# Patient Record
Sex: Female | Born: 1995
Health system: Southern US, Community
[De-identification: ages and names within clinical notes are randomized; demographics above are authoritative.]

## PROBLEM LIST (undated history)

## (undated) DIAGNOSIS — J0391 Acute recurrent tonsillitis, unspecified: Secondary | ICD-10-CM

## (undated) HISTORY — PX: WISDOM TOOTH EXTRACTION: SHX21

---

## 2016-09-11 ENCOUNTER — Other Ambulatory Visit: Payer: Self-pay | Admitting: Family Medicine

## 2016-09-11 ENCOUNTER — Other Ambulatory Visit (HOSPITAL_COMMUNITY)
Admission: RE | Admit: 2016-09-11 | Discharge: 2016-09-11 | Disposition: A | Discharge status: Home / Self Care | Payer: 59 | Source: Ambulatory Visit | Attending: Family Medicine | Admitting: Family Medicine

## 2016-09-11 DIAGNOSIS — Z113 Encounter for screening for infections with a predominantly sexual mode of transmission: Secondary | ICD-10-CM | POA: Insufficient documentation

## 2016-09-11 DIAGNOSIS — R5383 Other fatigue: Secondary | ICD-10-CM | POA: Diagnosis not present

## 2016-09-11 DIAGNOSIS — Z124 Encounter for screening for malignant neoplasm of cervix: Secondary | ICD-10-CM | POA: Diagnosis not present

## 2016-09-11 DIAGNOSIS — Z23 Encounter for immunization: Secondary | ICD-10-CM | POA: Diagnosis not present

## 2016-09-11 DIAGNOSIS — Z0000 Encounter for general adult medical examination without abnormal findings: Secondary | ICD-10-CM | POA: Diagnosis not present

## 2016-09-14 LAB — CYTOLOGY - PAP

## 2016-09-26 DIAGNOSIS — Z91018 Allergy to other foods: Secondary | ICD-10-CM | POA: Diagnosis not present

## 2016-09-26 DIAGNOSIS — L509 Urticaria, unspecified: Secondary | ICD-10-CM | POA: Diagnosis not present

## 2016-10-08 DIAGNOSIS — M545 Low back pain: Secondary | ICD-10-CM | POA: Diagnosis not present

## 2016-10-08 DIAGNOSIS — M791 Myalgia: Secondary | ICD-10-CM | POA: Diagnosis not present

## 2016-11-13 ENCOUNTER — Encounter: Payer: 59 | Attending: Family Medicine | Admitting: *Deleted

## 2016-11-13 DIAGNOSIS — Z713 Dietary counseling and surveillance: Secondary | ICD-10-CM | POA: Diagnosis not present

## 2016-11-13 DIAGNOSIS — E440 Moderate protein-calorie malnutrition: Secondary | ICD-10-CM

## 2016-11-13 DIAGNOSIS — F509 Eating disorder, unspecified: Secondary | ICD-10-CM

## 2016-11-13 NOTE — Progress Notes (Signed)
Appointment start time: 1700  Appointment end time: 1800  Patient was seen on 11/13/16 for nutrition counseling pertaining to disordered eating  Primary care provider: Jarrett Sohoourtney Wharton PA Therapist: NA.  Referred to [facility] Any other medical team members: Dr. Riley KillSwartz, functional medicine   Assessment Sophomore year of college went to [place]  Fell into binge eating after that trip and not sure why.  Always had sweet tooth, but starting cycling eating a lot of cookies and then over-exercising and restriction.  used my fitness pal and was very restrictive and counted calories.  Ate 2200 kcal or as low as 1400  Gained 15 pounds.  That was an emotional struggle.  She realized that the gym was an image thing and that was not healthy.  Started calorie counting beofre binging started and then afterwards.  August of junior year, decided to stop.  It was better, but not consistent.  Did not get professional help.  Faith very strong.  Things have gotten slowly, slowly better.  Not actually counting, but mentally counting.  Switched to [employer] and that was somewhat helpful.  Senior year she started healing some, but then all this happened (see below). Her diet is so restricted, it's triggering.  She started going back to the gym about 3 weeks ago and feels it's healthy.  Her mind recognizes how she used to think.  She wants to make sure she has the healing she needs.   She is wanting to switch into behavior health for adolescents.  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  Starting this may everything I ate hurt so bad.  Self-diagnosed "leaky gut".  Found parasite in her stool for the past 3 years form [place].  Feels like she is depleted of many things.   She is taking some enzymes and some cortisol stimulants, D3, Zn, Mg (topical), pro biotic, prebiotics.  Every three weeks she does a liver flush and stops taking these and takes sublingual  supplemets that "are supposed to kill" whatever is in her liver.  She is taking a variety of other supplements and antofungal.  Then she "flushes that out and goes back to her "normal cycle of supplements."  She has been doing this since August.  She is feeling somewhat better, but still really fatigued.  She is working night shifts and that doesn't help. She has several food intolerances and she stopped eating things she has issues with ( see list ) Also doesn't eat dairy and gluten  And then also stopped eating her allergens and that helps her feel better.   Struggles getting in protein.  Cant' eat any meat, eggs, beans, soy, peas  Can have some nuts (pecans, peanuts, pumpkin seeds, maybe chia seeds  She knows this is a temporary situation and that as her gut heals she will be able to reintroduce foods    Weight history:  Highest weight: 133 lb   Lowest weight: 110 lb Most consistent weight: 118 lb  What would you like to weigh:doesnt' matter How has weight changed in the past year: lost about 20 pounds, then restored about 5-7  Medical Information:  Changes in hair, skin, nails since ED started: feels like hair is thinner, always had thin nails Chewing/swallowing difficulties : none Relux or heartburn: heaviness on her chest Trouble with teeth: none LMP without the use of hormones: April, cycles irregular.  Low hormone  Weight at that point: 130 lb  Effect of exercise on menses : irregular with excessive exercise    Constipation, diarrhea: normal BM Dizziness/lightheadedness with postural changes or overexertion Headaches when she ate allergens.  Better Poor energy/fatigue Good mood Difficulty focusing/concentrating Poor sleep.  That is a long standing issue.  Takes melatonin to sleep during the day.  Anxiety.  Never taken medication struggles with short term memory loss  Mental health diagnosis: unknown at this time.  May meet criteria for OSFED   Dietary assessment: A typical  day consists of 2 meals and 5 snacks Very restricted not due to ED thoughts, but to perceived medical necessity   foods consummed include: see list Avoided foods include: see list  24 hour recall:  B: buckwheat with strawberries  3 sweet potato, yellow squash, broccoli, colla-gelatin Grapes, applesauce Water, sufficient  Administered EAT-26 Score significant >20 Patient score: 14  Terrified of being overweight; aware of the calories I eat; gone on binges 2-3 times/month  ORTO-15 Score significant <39 Patient score: 31  Estimated energy intake: 900 kcal  Estimated energy needs: 2000-2400 kcal 250-300 g CHO 100-120 g pro 67-80 g fat  Nutrition Diagnosis: NI-1.4 Inadequate energy intake As related to restrictive diet from perceived food intolerances.  As evidenced by dietary recall.  Intervention/Goals: Nutrition counseling provided.  Built rapport.  Again recommended [facility].  This provider questions the necessity of such a severely restricted diet.  Many of her symptoms could be improved with improved nutrition.  Will focus on improving nutritional status with "approved foods" first.    Gave recommended foods with higher protein content   Monitoring and Evaluation: Patient will follow up in 1 weeks.

## 2016-11-13 NOTE — Patient Instructions (Signed)
Protein  Quinoa Sunbutter on rice cake Brewer's yeast, nutritional yeast Peanut Salmon Shrimp Flounder aramanth Hemp seeds/hemp milk Rice milk  broccoli Artichoke chia seeds Bison Venison   http://[email or web address].[email or web address]/store/dates-[email or web address]-seed   Bars [email or web address]   granola

## 2016-11-15 DIAGNOSIS — F502 Bulimia nervosa: Secondary | ICD-10-CM | POA: Diagnosis not present

## 2016-11-20 ENCOUNTER — Encounter: Payer: 59 | Admitting: *Deleted

## 2016-11-20 DIAGNOSIS — Z713 Dietary counseling and surveillance: Secondary | ICD-10-CM | POA: Diagnosis not present

## 2016-11-20 DIAGNOSIS — F509 Eating disorder, unspecified: Secondary | ICD-10-CM

## 2016-11-20 DIAGNOSIS — E441 Mild protein-calorie malnutrition: Secondary | ICD-10-CM

## 2016-11-20 NOTE — Patient Instructions (Addendum)
flaxmilk and flax yogurt  http://goodkarmafoods.com/product/unsweetened-protein-flaxmilk/  Food for [employer] Brown rice English Muffins  Chex cereal  EnerG tapioca loaf (orange fiber)  [place] Kitchen rice noodles Miracle Noodle Shirataki pasta Ranzoni gluten free noodles (some corn) King Soba buckwheat noodles  Consider doing some functional research and cook!

## 2016-11-20 NOTE — Progress Notes (Signed)
  Appointment start time: 0800  Appointment end time: 0900  Patient was seen on 11/20/16 for nutrition counseling pertaining to disordered eating  Primary care provider: Marda Stalker PA Therapist: NA.  Referred to [facility] Any other medical team members: Dr. Naaman Plummer, functional medicine   Assessment Tried all the food suggested last week and everything was fine, but chia seeds.  She ordered amaranth, but hasn't tried it yet.  Bought brown rice noodles, nutritional yeast.  She asked her functional medicine doctor about brewers yeast and she said not to.  "She is his wife is a "nutritional detox coach" and gives nutrition advice She tried cacoa and is using it in smoothies.  She bought rice milk, but feels that it had too much sugar for her gut.  She thinks she can't eat sugar because it will not be good for her "leaky gut".  She admits that she follows a more strict diet than directed because she wants to rid her body of the parasites and heal her leaky gut  Met with Lenna Sciara carmona and that went well    Is waiting to hear back about job placement   States she saw a GI I the past and felt unsupported.  She had been reading blogs and decided to persue functional medicine.  She is happy with the path that she is on.  She suspects this will take a couple months  Her stool sample was negative.  Her functional medicine doctor explained why that could happens because "traditional doctors don't test for the things she has".      Mental health diagnosis: unknown at this time.  May meet criteria for OSFED   Dietary assessment: A typical day consists of 2 meals and 5 snacks Very restricted not due to ED thoughts, but to perceived medical necessity   foods consummed include: see list Avoided foods include: see list  24 hour recall:  B: oatmeal with sunbutter, strawberries, nutritional yeast L: potato, salmon, broccoli (all plain), cantaloupe S: peacan, pumpkin seeds D:  flounder with spices and olive oil, zucchini and yellow squash S: PB and celery smooothie with rice milk, protein powder (rice protein), cacao Water, sufficient   Estimated energy intake: 1700 kcal  Estimated energy needs: 2000-2400 kcal 250-300 g CHO 100-120 g pro 67-80 g fat  Nutrition Diagnosis: NI-1.4 Inadequate energy intake As related to restrictive diet from perceived food intolerances.  As evidenced by dietary recall.  Intervention/Goals: Nutrition counseling provided. Questioned her "nutrition detox coach" and the highly restrictive "liver detox diet" she's on every 3 weeks.  Will continue to build upon her list of accepted foods.  She was willing to try some new things these next 2 weeks  Monitoring and Evaluation: Patient will follow up in 2 weeks.

## 2016-11-27 ENCOUNTER — Other Ambulatory Visit: Payer: Self-pay | Admitting: *Deleted

## 2016-11-29 DIAGNOSIS — F502 Bulimia nervosa: Secondary | ICD-10-CM | POA: Diagnosis not present

## 2016-12-06 ENCOUNTER — Encounter: Payer: 59 | Attending: Family Medicine | Admitting: *Deleted

## 2016-12-06 DIAGNOSIS — F509 Eating disorder, unspecified: Secondary | ICD-10-CM

## 2016-12-06 DIAGNOSIS — Z713 Dietary counseling and surveillance: Secondary | ICD-10-CM | POA: Insufficient documentation

## 2016-12-06 NOTE — Progress Notes (Signed)
  Appointment start time: 1500  Appointment end time: 1600  Patient was seen on 12/06/16 for nutrition counseling pertaining to disordered eating  Primary care provider: Jarrett Sohoourtney Wharton PA Therapist: NA.  Referred to [facility] Any other medical team members: Dr. Riley KillSwartz, functional medicine   Assessment She is on her liver flush week, but has been making her own food using the foods that she is allowed to have.  She is cooking more and she is more satisfied.  She is still not using fat.  She is eating a lot of fruit too.  She is consumed with thoughts of food.  She realizes this is not a healthy thought process She is trying to be more aware of what she is eating and when. She states she is satisfies with the food that she is eating even though she is restricting  She has been reading Intuitive Eating.  She stopped eating actually as it is not applicable to her diet right now She is noticing that her behaviors.     Mental health diagnosis: unknown at this time.  May meet criteria for OSFED   Dietary assessment: A typical day consists of 2 meals and 5 snacks Very restricted not due to ED thoughts, but to perceived medical necessity   foods consummed include: see list Avoided foods include: see list  24 hour recall:  B: buckwheat with strawberries S: Applesauce L: noodles with butternut squash SHoneydew spaghetti squash with pumpin puree Rice with vegetables.   Estimated energy intake: 1700 kcal  Estimated energy needs: 2000-2400 kcal 250-300 g CHO 100-120 g pro 67-80 g fat  Nutrition Diagnosis: NI-1.4 Inadequate energy intake As related to restrictive diet from perceived food intolerances.  As evidenced by dietary recall.  Intervention/Goals: Nutrition counseling provided. Answered questions.  She questions the approach she is currently taking as it does not align with her recovery process.  She is frustrated, however.  She plans to do more research.  Gave  more recipes for foods she can eat.    Monitoring and Evaluation: Patient will follow up in 2 weeks.

## 2016-12-13 DIAGNOSIS — M25511 Pain in right shoulder: Secondary | ICD-10-CM | POA: Diagnosis not present

## 2016-12-13 DIAGNOSIS — S43431D Superior glenoid labrum lesion of right shoulder, subsequent encounter: Secondary | ICD-10-CM | POA: Diagnosis not present

## 2016-12-13 DIAGNOSIS — F5081 Binge eating disorder: Secondary | ICD-10-CM | POA: Diagnosis not present

## 2016-12-13 DIAGNOSIS — M6281 Muscle weakness (generalized): Secondary | ICD-10-CM | POA: Diagnosis not present

## 2016-12-17 DIAGNOSIS — R140 Abdominal distension (gaseous): Secondary | ICD-10-CM | POA: Diagnosis not present

## 2016-12-17 DIAGNOSIS — R109 Unspecified abdominal pain: Secondary | ICD-10-CM | POA: Diagnosis not present

## 2016-12-17 DIAGNOSIS — R195 Other fecal abnormalities: Secondary | ICD-10-CM | POA: Diagnosis not present

## 2016-12-19 ENCOUNTER — Encounter: Payer: 59 | Admitting: *Deleted

## 2016-12-19 DIAGNOSIS — Z713 Dietary counseling and surveillance: Secondary | ICD-10-CM | POA: Diagnosis not present

## 2016-12-19 DIAGNOSIS — F509 Eating disorder, unspecified: Secondary | ICD-10-CM

## 2016-12-19 NOTE — Progress Notes (Signed)
Appointment start time: 0800  Appointment end time: 0900  Patient was seen on 12/19/16 for nutrition counseling pertaining to disordered eating  Primary care provider: Jarrett Sohoourtney Wharton PA Therapist: Aaron MoseMelissa Carmona Any other medical team members: Dr. Riley KillSwartz, functional medicine   [facility]  Provider is waiting for stool sample in order to receive treatment.  Has appointment coming up with allergist.  Jessica BarriosWonders if she should keep it?    She plans to take pieces from each provider and find the right balance for her.  She decided to prioritize her mental health and has been eating lots of cake!!! "no one told me not to eat these things, I just did it myself." It hurt, but felt really good mentally/emotioanlly.   She realizes she is "mentally dying" and she really needs to heal her realtionship with food.  She also ate some reese's yesterday.  She gets bloated and crampy and sometimes nauseated, but is ok with that as she is starting to enjoy food again  She ate a lot of cake and didn't feel guilty! She felt free and that cake doesn't have control over her  She is trying to be more mindful when eating sweets.  She is talking herself through it and challenging Ed thoughts. She is more aware of how she feels when eating pie/cake.  At a party ate deviled eggs, mac-n-cheese, potato salad, roll with butter, collard greens.  She did feel terribly physically afterwards.    She is reading Intutiive Eating still.  She is struggling with how can she heal both physically and mentally? Is eating less fruit unintentionally.  Has been eating some frozen fruit and dates Tried raw honey and thinks she got a headache as a result  Found cashew yogurt and it's ok.  Has a lot of questions about other supplements like butyric acid, Bromaline, colostrum, turmeric, multivitamin, and probiotics      Mental health diagnosis: unknown at this time.  May meet criteria for OSFED     Estimated energy  needs: 2000-2400 kcal 250-300 g CHO 100-120 g pro 67-80 g fat  Nutrition Diagnosis: NI-1.4 Inadequate energy intake As related to restrictive diet from perceived food intolerances.  As evidenced by dietary recall.  Intervention/Goals: Nutrition counseling provided. Encouraged keeping all appointments with evidenced-based physicians, including allergist.   Had discussion about all the supplements she is taking.. Are they necessary?  Are they helpful?  They are expensive.  Focus on getting rid of parasites per GI instructions, then work on the "leaky gut".  So excited for her increased food variety.  Discussed how to move forward with protein.  Gave some suggestions for supplements.  Addressed sweets eating as side effect of restriction.  Will normalize   Monitoring and Evaluation: Patient will follow up in 2 weeks.

## 2016-12-19 NOTE — Patient Instructions (Addendum)
For a multivitmin try Centrum For fish oil try Nordic Naturals or Nature's Made Probiotic.  Culturelle has 1 strain, but it's what my doctor friends recommend  Gradually try 1 new protein or other food in its most basic form to see how it affects you. See what Kefir does

## 2016-12-21 ENCOUNTER — Ambulatory Visit: Payer: 59 | Admitting: *Deleted

## 2016-12-26 DIAGNOSIS — F5081 Binge eating disorder: Secondary | ICD-10-CM | POA: Diagnosis not present

## 2017-01-01 ENCOUNTER — Encounter: Payer: 59 | Attending: Family Medicine | Admitting: *Deleted

## 2017-01-01 DIAGNOSIS — F509 Eating disorder, unspecified: Secondary | ICD-10-CM

## 2017-01-01 DIAGNOSIS — Z713 Dietary counseling and surveillance: Secondary | ICD-10-CM | POA: Diagnosis not present

## 2017-01-01 NOTE — Progress Notes (Signed)
Appointment start time: 0800  Appointment end time: 0900  Patient was seen on 01/01/17 for nutrition counseling pertaining to disordered eating  Primary care provider: Jarrett Sohoourtney Wharton PA Therapist: Aaron MoseMelissa Carmona Any other medical team members: Dr. Riley KillSwartz, functional medicine; [facility]   Assessment Is on her liver flush and plans to give stool sample to GI this week. [facility].  Needs to work days for her health.  She is not taking anything new this week, but will be taking probiotic, prebiotic, omega-3, multi, butyrate, magnesium, zinc, and will hold off on colostrum.  Wants to try sourkraut and maybe kamboucha for natural gut flora. Stopped taking everything else. Plans to keep allergist appointment on 1/16  Tried roast beef sandwich and it was ok at first.  Duke Energyried luncheon meat later and it wasn't good.  Tried pea protein drink and it made her throat itchy.  Plans to try beans Tried GF waffles and it was ok.  Tried Cookout milkshake and it was somewhat bloaty.  Did not try kefir  Had been making progress with herself mentally.  Knew the flush week would be hard and tried to give herself permission and grace.  Still feels good about her progress.  Is still reading Intuitive Eating and is trying to give herself more positive affirmations about her eating and her body.    She has been practicing inactive eating at a christmas buffet and did great.  She was really proud of herself   tried 1% milk and throat felt a little itchy Tried greek yogurt and that was a big no (it was flavored)  Is thinking about shortening her next flush to 3 days only.  Concerned about her future diet once parasites are gone.  Her "research" indicates needing to limit grains, gluten, and soy.  How will she be able to eat intuitively if she's on a chronically restricted diet?   Mental health diagnosis: unknown at this time.  May meet criteria for OSFED   Dietary recall White  rice caulilfour  Sweet potatoes Applesauce and applejuice Zucchini noodles with peppers and nutritional yeast Greek yogurt  Estimated energy needs: 2000-2400 kcal 250-300 g CHO 100-120 g pro 67-80 g fat  Nutrition Diagnosis: NI-1.4 Inadequate energy intake As related to restrictive diet from perceived food intolerances.  As evidenced by dietary recall.  Intervention/Goals: Nutrition counseling provided.  Discussed realistic expectations and giving herself permission and grace.  Discussed not knowing what her future nutrition needs will be as she is not healed yet.  She may not need to follow a restricted diet in the future.  I suspect it will not be necessary, but will consult with literature and a colleague to figure out what course of action is best for a "leaky gut."  She will probably be able to eat more once she is healed.  Even so, focus on what she can eat, rather than what she can't and give permission for flexibility.    Continue to try incorporating things back into diet.  (plans to try beef and kefir). Keep appointments with allergist and GI   Monitoring and Evaluation: Patient will follow up in 2 weeks.

## 2017-01-01 NOTE — Patient Instructions (Addendum)
Try Valerian for sleep. Tea, tincture (available everywhere try [employer] or online), or supplement Check out [address] [facility] [employer], both on [address]  Siete Azahares tea

## 2017-01-02 DIAGNOSIS — R195 Other fecal abnormalities: Secondary | ICD-10-CM | POA: Diagnosis not present

## 2017-01-11 DIAGNOSIS — F5081 Binge eating disorder: Secondary | ICD-10-CM | POA: Diagnosis not present

## 2017-01-15 ENCOUNTER — Ambulatory Visit (INDEPENDENT_AMBULATORY_CARE_PROVIDER_SITE_OTHER): Payer: 59 | Admitting: Allergy and Immunology

## 2017-01-15 ENCOUNTER — Encounter: Payer: 59 | Admitting: *Deleted

## 2017-01-15 ENCOUNTER — Encounter: Payer: Self-pay | Admitting: Allergy and Immunology

## 2017-01-15 VITALS — BP 80/62 | HR 60 | Temp 97.6°F | Resp 20 | Ht 63.35 in | Wt 118.4 lb

## 2017-01-15 DIAGNOSIS — J3089 Other allergic rhinitis: Secondary | ICD-10-CM | POA: Diagnosis not present

## 2017-01-15 DIAGNOSIS — B89 Unspecified parasitic disease: Secondary | ICD-10-CM | POA: Diagnosis not present

## 2017-01-15 DIAGNOSIS — G43909 Migraine, unspecified, not intractable, without status migrainosus: Secondary | ICD-10-CM

## 2017-01-15 DIAGNOSIS — F509 Eating disorder, unspecified: Secondary | ICD-10-CM

## 2017-01-15 DIAGNOSIS — N912 Amenorrhea, unspecified: Secondary | ICD-10-CM

## 2017-01-15 DIAGNOSIS — T781XXA Other adverse food reactions, not elsewhere classified, initial encounter: Secondary | ICD-10-CM | POA: Diagnosis not present

## 2017-01-15 DIAGNOSIS — Z91018 Allergy to other foods: Secondary | ICD-10-CM

## 2017-01-15 DIAGNOSIS — Z713 Dietary counseling and surveillance: Secondary | ICD-10-CM | POA: Diagnosis not present

## 2017-01-15 MED ORDER — AUVI-Q 0.3 MG/0.3ML IJ SOAJ: INTRAMUSCULAR | 1 refills | Status: DC

## 2017-01-15 MED ORDER — IVERMECTIN 3 MG PO TABS: ORAL_TABLET | ORAL | 0 refills | Status: DC

## 2017-01-15 NOTE — Patient Instructions (Signed)
Mug cake Mix flour, sugar, cocoa powder, baking soda, and salt in a microwave-safe mug; stir in milk, canola oil, water, and vanilla extract. Cook in microwave until cake is done in the middle, about 1 minute 45 seconds.  [email or web address]/mug-cakes/  Try sorbet (dairy free) or freeze flax milk so it's kinda like ice creamy Rice pudding So Delicious brand cashew milk ice cream

## 2017-01-15 NOTE — Progress Notes (Signed)
Dear Jarrett Sohoourtney Wharton,  Thank you for referring Jessica Finley to the [facility] [place] on 01/15/2017.   Below is a summation of this patient's evaluation and recommendations.  Thank you for your referral. I will keep you informed about this patient's response to treatment.   If you have any questions please do not hesitate to contact me.   Sincerely,  Jessica PriestEric J. Azan Maneri, MD [facility] [place]   ______________________________________________________________________    NEW PATIENT NOTE  Referring Provider: Jarrett SohoWharton, Courtney, PA-C Primary Provider: Jarrett SohoWharton, Courtney, PA-C Date of office visit: 01/15/2017    Subjective:   Chief Complaint:  Jessica Finley (DOB: 07/09/1996) is a 21 y.o. female who presents to the clinic on 01/15/2017 with a chief complaint of Abdominal Pain .     HPI: Maralyn SagoSarah presents to this clinic in evaluation of food problems.  Apparently she's been having difficulty with specific food product consumption. If she eats most fruits or legumes and avocado and cashew and almonds and cinnamon and egg she will develop itchy throat. If she has cooked fruits such as cooked apple she does fine. She's tried an antihistamine which has helped this issue. In addition, she will sometimes develops stomach cramps with consumption of mammal and chicken and dairy and gluten. She has no other associated systemic or constitutional symptoms associated with these reactions. This has been a problem over the course of the past several years.  Evaluation to date has included blood test which apparently did not identify any celiac disease and detection of IgE antibodies directed against multiple food products.  In addition, she states that she has a parasitic infection of her gout. She has seen worms in her stools. She's had 2 stools specimens apparently examined for ova and parasite which have been negative. However,  even though these are negative she has seen definite worms in her stool. She is now being treated by a homeopathic doctor, Dr. Hermelinda MedicusSchwartz, and is using "liver flushes" and various other homeopathic treatments in an attempt to eliminate her parasitic load.  She also has a history of itchy eyes and sneezing and some nasal congestion during the spring time. In addition, she definitely gets itchy throat for the spring and into the fall. She has had a history of "recurrent tonsillitis" during her college years that appear to respond to the administration of an antihistamine. She has minimal GERD symptoms and does not have any throat clearing or recurrent raspy voice or glob stuck in her throat.  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 She has this bitemporal throbbing headache unassociated with any visual scotoma or other neurological symptoms that last about 5 hours. She can function during this timeframe. She believes that consumption of chicken gives rise to migraine. But she also has migraines about every few days averaging out twice a week even without consumption of chicken. She does eat chocolate about 1 time per week but does not have any significant caffeine consumption otherwise.  Past Medical History:  Diagnosis Date  . Parasitic infection 2017   Infection in liver.    History reviewed. No pertinent surgical history.  Allergies as of 01/15/2017   No Known Allergies     Medication List      COD LIVER OIL PO Take 5 mLs  by mouth daily.   multivitamin capsule Take 1 capsule by mouth daily.   NUTRITIONAL SUPPLEMENT PO Take by mouth. Bilex.   PROBIOTIC-10 PO Take by mouth.       Review of systems negative except as noted in HPI / PMHx or noted below:  Review of Systems  Constitutional: Negative.   HENT: Negative.   Eyes:  Negative.   Respiratory: Negative.   Cardiovascular: Negative.   Gastrointestinal: Negative.   Genitourinary: Negative.   Musculoskeletal: Negative.   Skin: Negative.   Neurological: Negative.   Endo/Heme/Allergies: Negative.   Psychiatric/Behavioral: Negative.     Family History  Problem Relation Age of Onset  . High blood pressure Father   . Lactose intolerance Father   . Food Allergy Brother   . Congestive Heart Failure Maternal Grandfather   . Diabetes Paternal Grandmother   . Congestive Heart Failure Paternal Grandfather   . Kidney disease Paternal Grandfather     Social History   Social History  . Marital status: Unknown    Spouse name: N/A  . Number of children: N/A  . Years of education: N/A   Occupational History  . Not on file.   Social History Main Topics  . Smoking status: Never Smoker  . Smokeless tobacco: Never Used  . Alcohol use Yes  . Drug use: No  . Sexual activity: Not on file   Other Topics Concern  . Not on file   Social History Narrative  . No narrative on file    Environmental and Social history  Lives in a apartment with a dry environment, carpeting in the bedroom, no plastic on the bed or pillow, and no smoking ongoing inside the household. 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  Objective:   Vitals:   01/15/17 0914  BP: (!) 80/62  Pulse: 60  Resp: 20  Temp: 97.6 F (36.4 C)   Height: 5' 3.35" (160.9 cm) Weight: 118 lb 6.4 oz (53.7 kg)  Physical Exam  Constitutional: She is well-developed, well-nourished, and in no distress.  HENT:  Head: Normocephalic. Head is without right periorbital erythema and without left periorbital erythema.  Right Ear: Tympanic membrane, external ear and ear canal normal.  Left Ear: Tympanic membrane, external ear and ear canal normal.  Nose: Nose normal. No mucosal edema or rhinorrhea.  Mouth/Throat: Oropharynx is clear and moist and mucous membranes are  normal. No oropharyngeal exudate.  Eyes: Conjunctivae and lids are normal. Pupils are equal, round, and reactive to light.  Neck: Trachea normal. No tracheal deviation present. No thyromegaly present.  Cardiovascular: Normal rate, regular rhythm, S1 normal, S2 normal and normal heart sounds.   No murmur heard. Pulmonary/Chest: Effort normal. No stridor. No tachypnea. No respiratory distress. She has no wheezes. She has no rales. She exhibits no tenderness.  Abdominal: Soft. She exhibits no distension and no mass. There is no hepatosplenomegaly. There is no tenderness. There is no rebound and no guarding.  Musculoskeletal: She exhibits no edema or tenderness.  Lymphadenopathy:       Head (right side): No tonsillar adenopathy present.       Head (left side): No tonsillar adenopathy present.    She has no cervical adenopathy.    She has no axillary adenopathy.  Neurological: She is alert. Gait normal.  Skin: No rash noted. She is not diaphoretic. No erythema. No pallor. Nails show no clubbing.  Psychiatric: Mood and affect normal.  Diagnostics: Allergy skin tests were performed. She demonstrated hypersensitivity against various aeroallergens including grasses and weeds and trees and cat and house dust mite. She also demonstrated hypersensitivity against almonds and hazelnut and mustard. She had very slight hypersensitivity against oats.  Assessment and Plan:    1. Pollen-food allergy, initial encounter   2. Food allergy   3. Other allergic rhinitis   4. Parasite infection   5. Migraine syndrome     1. Allergen avoidance measures  2. Can utilize daily antihistamine: Loratadine 10, cetirizine 10, fexofenadine 180 mg  3. If needed: Auvi-Q 3.0, benadryl, M.D./ER for allergic reaction  4. Ivermectin 3 mg tablet - 3 tablets single dose  5. 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 Attempt to convert to daytime work schedule  7. Eliminate all caffeine and chocolate  consumption  8. Blood - Alpha gal panel  9. Contact clinic in 4 weeks with update  Band has classic oral allergy syndrome with occasional systemic symptoms and she has the option of using an antihistamine on daily basis which may help some of her oral symptoms. I did have a talk with her today about recognizing a systemic reaction should ever occur including the treatment of a systemic reaction with injectable epinephrine. As well, to completely clear out the possibility of her having a parasitic infection of her gut we will treat her with a single dose of ivermectin which will eliminate this issue. We could get more stool samples and further investigation for parasitosis but I think empiric therapy with 1 dose of ivermectin should put this issue to rest once and for all. She has migraine headaches and this is a problem with her shift work and I did have a talk with her about the need to possibly consider shifting over to daytime work. If she is going to remain with nighttime shift work then she should immediately eliminate all light exposure after work until that point in time until she goes to bed during the daytime. She'll contact me noting her response to this approach and we'll make a decision about further evaluation and treatment based upon this response. To be complete we will check an alpha gal panel given her issues of intermittent cramping of her gut.  Jessica Priest, MD [facility] [place]

## 2017-01-15 NOTE — Progress Notes (Signed)
Appointment start time: 0730  Appointment end time: 0830  Patient was seen on 01/15/17 for nutrition counseling pertaining to disordered eating  Primary care provider: Jarrett Sohoourtney Wharton PA Therapist: Aaron MoseMelissa Carmona Any other medical team members: Dr. Riley KillSwartz, functional medicine; [facility]   Assessment Tried new foods.  Black beans and beef are tolerable.  Tried komboucha and it was eventually ok. Tried various forms of alcohol a different points and that was all ok.  Thinks her flushes are "finally working" as she feels like her immune system was "boosted."  Next flush starts tomorrow.  working nights are challenging as she doesn't know how to eat. She binges when she gets home. She wonders if she should eat meals while at work?   Read Intuitive Eating and didn't feel shameful. She was aware of what she was doing and felt freeing.  She has been eating [employer] and Cheesecakes By Trinna PostAlex. Thinks this happens when she restricts and/or is anxious.  Last night she was "nourished" but maybe anxious??  How can she "control her anxious?" Complains of symptoms similar to gastorparesis.  Eats every 2-3 hours but can only eat a little bit  Realizes she is a fast eater  Stool sample came back negative.  So will not be taking antiparasitic.  Is going to continue flusing.   Has several questions  Mental health diagnosis: unknown at this time.  May meet criteria for OSFED  Estimated energy needs: 2000-2400 kcal 250-300 g CHO 100-120 g pro 67-80 g fat  Nutrition Diagnosis: NI-1.4 Inadequate energy intake As related to restrictive diet from perceived food intolerances.  As evidenced by dietary recall.  Intervention/Goals: Nutrition counseling provided.  Discussed realistic expectations and giving herself permission and grace.  Discussed not knowing what her future nutrition needs will be as she is not healed yet.  She may not need to follow a restricted diet in the future.  Gastroparesis often needs  low fiber diet, which is not possible for her right now.  Do what you can   Continue to try incorporating things back into diet.   Monitoring and Evaluation: Patient will follow up in 2 weeks.

## 2017-01-15 NOTE — Patient Instructions (Addendum)
  1. Allergen avoidance measures  2. Can utilize daily antihistamine: Loratadine 10, cetirizine 10, fexofenadine 180 mg  3. If needed: Auvi-Q 3.0, benadryl, M.D./ER for allergic reaction  4. Ivermectin 3 mg tablet - 3 tablets single dose  5. 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 Attempt to convert to daytime work schedule  7. Eliminate all caffeine and chocolate consumption  8. Blood - alpha gal panel  9. Contact clinic in 4 weeks with update

## 2017-01-22 DIAGNOSIS — R7989 Other specified abnormal findings of blood chemistry: Secondary | ICD-10-CM | POA: Diagnosis not present

## 2017-01-22 LAB — ALPHA-GAL PANEL
Beef (Bos spp) IgE: <0.1 kU/L (ref <0.35)
Class Interpretation: 0
Class Interpretation: 0
LAMB CLASS INTERPRETATION: 0
Lamb/Mutton (Ovis spp) IgE: <0.1 kU/L (ref <0.35)

## 2017-01-22 LAB — PLEASE NOTE

## 2017-01-25 DIAGNOSIS — F5081 Binge eating disorder: Secondary | ICD-10-CM | POA: Diagnosis not present

## 2017-01-28 ENCOUNTER — Ambulatory Visit: Payer: 59 | Admitting: *Deleted

## 2017-02-15 DIAGNOSIS — F5081 Binge eating disorder: Secondary | ICD-10-CM | POA: Diagnosis not present

## 2017-02-27 DIAGNOSIS — F5081 Binge eating disorder: Secondary | ICD-10-CM | POA: Diagnosis not present

## 2017-03-13 DIAGNOSIS — F5081 Binge eating disorder: Secondary | ICD-10-CM | POA: Diagnosis not present

## 2017-03-26 DIAGNOSIS — F5081 Binge eating disorder: Secondary | ICD-10-CM | POA: Diagnosis not present

## 2017-04-03 DIAGNOSIS — F5081 Binge eating disorder: Secondary | ICD-10-CM | POA: Diagnosis not present

## 2017-04-16 DIAGNOSIS — F5081 Binge eating disorder: Secondary | ICD-10-CM | POA: Diagnosis not present

## 2017-04-17 ENCOUNTER — Encounter: Payer: 59 | Attending: Family Medicine | Admitting: *Deleted

## 2017-04-17 DIAGNOSIS — F509 Eating disorder, unspecified: Secondary | ICD-10-CM

## 2017-04-17 DIAGNOSIS — Z713 Dietary counseling and surveillance: Secondary | ICD-10-CM | POA: Diagnosis not present

## 2017-04-17 NOTE — Patient Instructions (Signed)
Add gluten free starch to each meal Consider eating more slowly consider putting phone away - listen to music maybe

## 2017-04-17 NOTE — Progress Notes (Signed)
Appointment start time: 1700  Appointment end time: 1800  Patient was seen on 04/17/17 for nutrition counseling pertaining to disordered eating   Assessment Has not been seen by this provider since January  is working days now.  Is liking working with geriatrics.  She is sleeping better.  She is planning her wedding Her gut issues are "on the back burner"  Stopped doing her gut flushes and is trying to heal her gut.  Is taking probiotics and vitamins and supplements Is able to tolerate many more foods Is sensitive to dairy Is still trying to cook gluten free, but does eat gluten when she goes out to eat  Started shirting to binge eating Was binging and restricting in college.  And then stopped herself.  Has been binging for about 3-4 months.   Has not binged in 4 days.   Is not sure where this came back, but thinks it comes from childhood/adolescent stuff that she is working with [employer].  Is journal and praying and self exploring.    Still taking colostrom  Is trying to walk the line between giving herself permission, but not eating too much Is realizing what her triggers are: not feeling worthy  Sees melissa every week to every other week  Realizes that cutting out sugar is stupid  Coworkers make comments about her eating habits  Gets full easily.  Can't eat much at a time Is not eating until satisfaction- just eats what is there Can't figure out her fullness cues Knows she eats really fast  Feels bloated often    No N/V No heart burn stomache when she binge and when she has dairy Normal BM when she isn't binging.  Binging constipates her No dizziness "lots of headaches" doesn't take anything.  Wonder if it's allergies? Positive for cold intolerance Hair loss- did dye hair No difficulty focusing/concentrating No problems with teeth- going to dentist tomorrow Taking melatonin to help with sleep due to anxiety.  Will not take anything for this prescriptive Energy is  improving.  Exercises 3 days/week.  Binging makes her sluggish   Dietary assessment: A typical day consists of 3 meals and 1 snacks  Safe foods include: meat, vegetables, fruit, avocado, non-dairy yogurt, eggs Nut butter, cereal, any starch,  Avoided foods include:anything with sugar (dessert) any meal eating out  When she eats out she deliberately eats things she will not eat at home  Anything with caffeine Binge foods: any kind of dessert   24 hour recall:  B: fried egg in coconut oil.  Dairy free yogurt with chocolate and cashews S: orange L: rice noodles with steamble and chicken S: 6 donuts D: leftover dame's chicken and waffles.  (waffle and squash) Beverages: water  Typical B: same or oatmeal with coconut oil and fruit or nuts S same L: same S: hummus with vegetable or guac D: chicken, PB, vegetables S: maybe something very small  Administered EAT-26 Patient score 10/2016: 14 Score today: 26  Reports daily binges, some enema use, some exercise used to control weight   Estimated energy intake: 1600 kcal outside of binge  Estimated energy needs: 2000-2400 kcal 250-300 g CHO 100-120 g pro 67-80 g fat  Nutrition Diagnosis: NB-1.5 Disordered eating pattern As related to restricting and binging.  As evidenced by eating disorder.  Intervention/Goals: Nutrition counseling provided.  She is not eating enough which is the main reason she is binging.  She is not eating enough because her eating disorder voice is louder than  she thinks it is.  Challenged those thoughts, provided nutrition education.  Recommended gluten-free (for now) starch with all meals and to consider eating more slowly without distractions.   Monitoring and Evaluation: Patient will follow up in 3 weeks.

## 2017-04-30 DIAGNOSIS — F5081 Binge eating disorder: Secondary | ICD-10-CM | POA: Diagnosis not present

## 2017-05-05 ENCOUNTER — Ambulatory Visit (HOSPITAL_COMMUNITY)
Admission: EM | Admit: 2017-05-05 | Discharge: 2017-05-05 | Disposition: A | Discharge status: Home / Self Care | Payer: 59 | Attending: Internal Medicine | Admitting: Internal Medicine

## 2017-05-05 ENCOUNTER — Encounter (HOSPITAL_COMMUNITY): Payer: Self-pay | Admitting: Emergency Medicine

## 2017-05-05 ENCOUNTER — Ambulatory Visit (INDEPENDENT_AMBULATORY_CARE_PROVIDER_SITE_OTHER): Payer: 59

## 2017-05-05 DIAGNOSIS — S99922A Unspecified injury of left foot, initial encounter: Secondary | ICD-10-CM | POA: Diagnosis not present

## 2017-05-05 DIAGNOSIS — M79672 Pain in left foot: Secondary | ICD-10-CM

## 2017-05-05 DIAGNOSIS — S93402A Sprain of unspecified ligament of left ankle, initial encounter: Secondary | ICD-10-CM | POA: Diagnosis not present

## 2017-05-05 MED ORDER — MELOXICAM 15 MG PO TABS: 15.0000 mg | ORAL_TABLET | Freq: Every day | ORAL | 1 refills | Status: DC

## 2017-05-05 NOTE — Discharge Instructions (Signed)
You have a sprained ankle. I recommend wrapping the ankle and Ace bandages, or with an ASO. I have prescribed a medicine called Meloxicam for pain. Take 1 tablet every day. You may take Tylenol every 4-6 hours for additional pain control, not to exceed 4,000 mg a day of this medicine. I recommend rest, ice, compression through the use of an ASO or Ace bandages, and elevate your ankle as much as possible. Should your pain persist or fail to resolve, follow up with an orthopedist or your primary care provider.

## 2017-05-05 NOTE — ED Triage Notes (Signed)
[facility] a complaint of left foot pain secondary to a fall earlier today.

## 2017-05-05 NOTE — ED Provider Notes (Signed)
CSN: [id number]     Arrival date & time 05/05/17  1715 History   First MD Initiated Contact with Patient 05/05/17 1851     Chief Complaint  Patient presents with  . Foot Pain   (Consider location/radiation/quality/duration/timing/severity/associated sxs/prior Treatment) The history is provided by the patient.  Ankle Pain  Location:  Ankle Time since incident:  1 day Injury: yes   Mechanism of injury: fall   Fall:    Fall occurred:  Walking   Impact surface:  Designer, fashion/clothing of impact:  Hands Ankle location:  L ankle Pain details:    Quality:  Aching and dull   Radiates to:  Does not radiate   Severity:  Moderate   Onset quality:  Gradual   Duration:  1 day   Timing:  Constant   Progression:  Worsening Chronicity:  New Prior injury to area:  No Relieved by:  Acetaminophen, NSAIDs and immobilization (crutches) Worsened by:  Bearing weight, extension, flexion and rotation Associated symptoms: decreased ROM and swelling   Associated symptoms: no itching, no muscle weakness, no neck pain, no numbness, no stiffness and no tingling     Past Medical History:  Diagnosis Date  . Parasitic infection 2017   Infection in liver.   History reviewed. No pertinent surgical history. Family History  Problem Relation Age of Onset  . High blood pressure Father   . Lactose intolerance Father   . Food Allergy Brother   . Congestive Heart Failure Maternal Grandfather   . Diabetes Paternal Grandmother   . Congestive Heart Failure Paternal Grandfather   . Kidney disease Paternal Grandfather    Social History  Substance Use Topics  . Smoking status: Never Smoker  . Smokeless tobacco: Never Used  . Alcohol use Yes   OB History    No data available     Review of Systems  Constitutional: Negative.   HENT: Negative.   Respiratory: Negative.   Cardiovascular: Negative.   Gastrointestinal: Negative.   Musculoskeletal: Positive for joint swelling. Negative for neck pain and stiffness.   Skin: Negative for itching.  Neurological: Negative.     Allergies  Almond oil and Food  Home Medications   Prior to Admission medications   Medication Sig Start Date End Date Taking? Authorizing Provider  ibuprofen (ADVIL,MOTRIN) 400 MG tablet Take 400 mg by mouth every 6 (six) hours as needed.   Yes [provider]  Multiple Vitamin (MULTIVITAMIN) capsule Take 1 capsule by mouth daily.   Yes [provider]  Probiotic Product (PROBIOTIC-10 PO) Take by mouth.   Yes [provider]  meloxicam (MOBIC) 15 MG tablet Take 1 tablet (15 mg total) by mouth daily. 05/05/17   Dorena Bodo, NP   Meds Ordered and Administered this Visit  Medications - No data to display  BP (!) 141/77 (BP Location: Right Arm)   Pulse 81   Temp 98.5 F (36.9 C) (Oral)   Resp 18   LMP 05/05/2017 (Exact Date)   SpO2 99%  No data found.   Physical Exam  Constitutional: She is oriented to person, place, and time. She appears well-developed and well-nourished. No distress.  HENT:  Head: Normocephalic and atraumatic.  Right Ear: External ear normal.  Left Ear: External ear normal.  Eyes: Conjunctivae are normal.  Musculoskeletal:       Left ankle: She exhibits swelling. She exhibits no deformity and normal pulse. Tenderness. AITFL tenderness found.  Neurological: She is alert and oriented to person, place,  and time.  Skin: Skin is warm and dry. Capillary refill takes less than 2 seconds. No rash noted. She is not diaphoretic. No erythema.  Psychiatric: She has a normal mood and affect. Her behavior is normal.  Nursing note and vitals reviewed.   Urgent Care Course     Procedures (including critical care time)  Labs Review Labs Reviewed - No data to display  Imaging Review Dg Foot Complete Left  Result Date: 05/05/2017 CLINICAL DATA:  Acute left foot pain following fall. Initial encounter. EXAM: LEFT FOOT - COMPLETE 3+ VIEW COMPARISON:  None. FINDINGS: There is no  evidence of fracture or dislocation. There is no evidence of arthropathy or other focal bone abnormality. Soft tissues are unremarkable. IMPRESSION: Negative. Electronically Signed   By: Harmon PierJeffrey  Hu M.D.   On: 05/05/2017 17:44        MDM   1. Sprain of left ankle, unspecified ligament, initial encounter    No bony abnormalities on x-ray. Most likely sprain, patient already has her own crutches, states she'll obtain her own ASO or Ace bandages from the pharmacy. Started on meloxicam, given work note. Follow up with orthopedics as necessary.    Dorena BodoKennard, Kayleb Warshaw, NP 05/05/17 1911

## 2017-05-07 DIAGNOSIS — F5081 Binge eating disorder: Secondary | ICD-10-CM | POA: Diagnosis not present

## 2017-05-08 ENCOUNTER — Ambulatory Visit: Payer: 59 | Admitting: *Deleted

## 2017-05-21 DIAGNOSIS — F5081 Binge eating disorder: Secondary | ICD-10-CM | POA: Diagnosis not present

## 2017-06-06 ENCOUNTER — Encounter: Payer: 59 | Attending: Family Medicine | Admitting: *Deleted

## 2017-06-06 DIAGNOSIS — Z713 Dietary counseling and surveillance: Secondary | ICD-10-CM | POA: Insufficient documentation

## 2017-06-06 DIAGNOSIS — F509 Eating disorder, unspecified: Secondary | ICD-10-CM | POA: Diagnosis not present

## 2017-06-06 NOTE — Progress Notes (Signed)
Appointment start time: 1630  Appointment end time: 1730  Patient was seen on 06/06/17 for nutrition counseling pertaining to disordered eating Therapy sporadically    Assessment Gut is doing better, but still bloated.  Saw alternative medicine guy who suggested more probiotics Suggested eating gluten as tolerated.  She is totally avoiding dairy.  He didn't change anything, but she decided not to eat sweets as it hurts her stomach/wants to learn how to depend on other things besides eating Knows that is her coping mechanism.  Thinks this is best for her gut and mental health.  Would like to bring that back eventually.  Is eating gluten and most other foods.  Doesn't feel deprived as this is her decision.  Thinks it's for her health, bu not for her body image.  This has been about 2 weeks.  States she is feeling pretty good.    She was eating sweets without binging, but stomach still was upset.  Feels like she just doesn't need it.  Previously she wanted it so much and felt deprived without it.  Now she doesn't feel deprived.   States sweets cause "instant bloating" and cramps, also gets shaky.  Feels physically gross.  Doesn't happen if she has a cookie, but does if she has a slice of cake.  Most desserts have dairy too.  Honey is fine, but syrup not ok.  Doesn't drink sugary beverages.  Sweet tea makes her feel badly too.    Wants to focus on her gut as that affects all the things.  Thinks her probiotic was actually making things worse so she switched to a different brand and things are much better. Bloating is better and her throat feels better.  Knows that certain food exacerbate things (avocado, tomatoes, bananas) and eating them more mindfully.     Some dizziness the other day, but thinks it's related to her period.  Periods are irregular.  Was referred to endocrine, but didn't go.  Previous lab results indicated low levels, but was amoenorrehic at the time.    Energy is varied.  Very tired at  end of the day.  While exercising, has really low energy Is sleeping really well unassisted.  Doesn't nap   Eating is going well, she reports.  Typically finishes everything on her plate, unless it's a lot of food.  Is a little disconcerting, but she thinks maybe it's the right amount?  Feels satisfied afterwards  Administered EAT-26 today.  Score insignificant!  Dietary assessment: A typical day consists of 3 meals and 1 snacks  Safe foods include: meat, vegetables, fruit, avocado, non-dairy yogurt, eggs Nut butter, cereal, any starch,  Avoided foods include:anything with sugar (dessert) any meal eating out  When she eats out she deliberately eats things she will not eat at home  Anything with caffeine Binge foods: any kind of dessert   24 hour recall:  B: 2 eggs cooked in coconut oil with 2 slices bread S; unsweet coconut milk yogurt with grapes and 3 tbsp PB L: roasted potatoes with chicken thighs cooked in EVOO D: eggs, potatoes, beef S: 4 tbsp PB, carrots Beverages: water (a lot)   Estimated energy intake: 1400-1600 kcal   Estimated energy needs: 2000-2400 kcal 250-300 g CHO 100-120 g pro 67-80 g fat  Physical activity: 3 days weight lifting some.  Yoga some..  Wants to start dancing again.  Recovering from strained ankle.    Nutrition Diagnosis: NB-1.5 Disordered eating pattern As related to restricting and binging.  As evidenced by eating disorder.  Intervention/Goals: Nutrition counseling provided.  She is not eating enough which is the main reason she feels out of control with sweet.  She is not eating enough because her eating disorder voice is louder than she thinks it is.  Challenged those thoughts, provided nutrition education.  Needs more foods.  Add starches and fruits.  Consider having dairy free sweets as part of the day Try low lactose foods with lactaid tablets  Monitoring and Evaluation: Patient will follow up prn

## 2017-08-26 ENCOUNTER — Ambulatory Visit (INDEPENDENT_AMBULATORY_CARE_PROVIDER_SITE_OTHER): Payer: 59 | Admitting: Internal Medicine

## 2017-08-26 ENCOUNTER — Encounter: Payer: Self-pay | Admitting: Internal Medicine

## 2017-08-26 VITALS — BP 104/64 | HR 62 | Ht 63.5 in | Wt 145.0 lb

## 2017-08-26 DIAGNOSIS — E2839 Other primary ovarian failure: Secondary | ICD-10-CM

## 2017-08-26 DIAGNOSIS — E2740 Unspecified adrenocortical insufficiency: Secondary | ICD-10-CM

## 2017-08-26 DIAGNOSIS — R7989 Other specified abnormal findings of blood chemistry: Secondary | ICD-10-CM

## 2017-08-26 NOTE — Progress Notes (Addendum)
Patient ID: Jessica Finley, female   DOB: 12-07-1996, 21 y.o.   MRN: [id number]    HPI  Jessica Finley is a 21 y.o.-year-old female, referred by her PCP, Jarrett Soho, PA-C, for evaluation for suspicion for adrenal insufficiency.  Pt. has been found to have a low cortisol level (however, after reviewing the actual report, the level were not clearly low...) after investigation by an integrative medicine provider ([facility]) in 07/2016. At that time, she also had low estrogen and progesterone and low DHEAS.   Pt describes that she started to have digestive issues ~2 yeas ago: pain/bloating/diarrhea after meals >> saw 2 GI drs >> no diagnosis and no relief. She then saw a naturopath who tested her for many things (including hormonal w/u as mentioned above). He prescribed many supplements which eventually helped but still having abd. Discomfort.  She also tells me that she had a poor body image since college >> exercising excessively and not eating >> lost a lot of weight. When she was started on a chicken broth diet to help with her digestive pbs and after she stopped OCPs >> menses stopped. Hormonal labs were checked by the naturopath when her weight was very low and she did not have cycles yet. Since then, she started to relax her exercise and eat more >> gained weight. Now she is having menses  - 36 days log.   No h/o steroid use in the past. No h/o Depo-provera, Megace, po ketoconazole, phenytoin, rifampin, chronic fluconazole use. No h/o autoimmune diseases in pt or family mbs. No excess use of NSAIDs. No h/o generalized infections or HIV. No IVDA. No h/o head injury or severe HA. No h/o malignancy.  Pt mentions: - + more fatigue lately - +  menses q36 days. 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  No known h/o hyponatremia or hyperkalemia. Reviewed latest CMP by PCP (09/11/2016) >> normal.  Pt. now exercises by doing weights and cardio 3 times a week.  ROS: + see hpi +  Constitutional: + see HPI Eyes: no blurry vision, no xerophthalmia ENT: + sore throat, no nodules palpated in throat, no dysphagia/odynophagia, no hoarseness Cardiovascular: no CP/SOB/palpitations/leg swelling Respiratory: no cough/SOB Gastrointestinal: no N/V/D/C/+ acid reflux Musculoskeletal: no muscle/joint aches Skin: no rashes Neurological: no tremors/numbness/tingling/dizziness Psychiatric: no depression/anxiety  Past Medical History:  Diagnosis Date  . Parasitic infection 2017   Infection in liver.   No past surgical history on file. Social History   Social History  . Marital status: Single, will get married this year    Spouse name: N/A  .  Number of children: 0   Occupational History  . RN, [employer]   Social History Main Topics  . Smoking status: Never Smoker  . Smokeless tobacco: Never Used  . Alcohol use Yes, 2-3 drinks once every 2 months   . Drug use: No   Current Outpatient Prescriptions on File Prior to Visit  Medication Sig Dispense Refill  . Multiple Vitamin (MULTIVITAMIN) capsule Take 1 capsule by mouth daily.    . Omega-3 Fatty Acids (FISH OIL) 1000 MG CAPS Take by  mouth.    . Probiotic Product (PROBIOTIC-10 PO) Take by mouth.    Marland Kitchen ibuprofen (ADVIL,MOTRIN) 400 MG tablet Take 400 mg by mouth every 6 (six) hours as needed.    . meloxicam (MOBIC) 15 MG tablet Take 1 tablet (15 mg total) by mouth daily. (Patient not taking: Reported on 06/06/2017) 30 tablet 1   No current facility-administered medications on file prior to visit.    Allergies  Allergen Reactions  . Almond Oil   . Food     hazelnut   Family History  Problem Relation Age of Onset  . High blood pressure Father   . Lactose intolerance Father   . Food Allergy Brother   . Congestive Heart Failure Maternal Grandfather   . Diabetes Paternal Grandmother   . Congestive Heart Failure Paternal Grandfather   . Kidney disease Paternal Grandfather    PE: BP 104/64 (BP Location: Left Arm, Patient Position: Sitting)   Pulse 62   Ht 5' 3.5" (1.613 m)   Wt 145 lb (65.8 kg)   LMP 07/20/2017   SpO2 98%   BMI 25.28 kg/m  Wt Readings from Last 3 Encounters:  08/26/17 145 lb (65.8 kg)  01/15/17 118 lb 6.4 oz (53.7 kg)   Constitutional: normal weight, in NAD Eyes: PERRLA, EOMI, no exophthalmos ENT: moist mucous membranes, no thyromegaly, no cervical lymphadenopathy Cardiovascular: RRR, No MRG Respiratory: CTA B Gastrointestinal: abdomen soft, NT, ND, BS+ Musculoskeletal: no deformities, strength intact in all 4 Skin: moist, warm, no rashes; no dark discoloration of skin Neurological: no tremor with outstretched hands, DTR normal in all 4  ASSESSMENT: 1. Low cortisol level  2. Low estrogen and Progesterone  PLAN:  1. Low cortisol level - we discussed that the diagnosis test for AI is a cosyntropin stimulation test. I explained what this consists of. We will plan to check this at 8 am >> will return for this. I advised her not to use any steroid products around the time of the test. - if pt turns out to have AI, we will need to check several tests to try to establish the etiology. - we  also discussed about proper replacement with Hydrocortisone in case she has AI >>  I explained side effects of overreplacement on the body and the fact that we need to decrease the dose to the minimum dose that allows her to feel well - a bid dose is likely best >> 10 mg in am and 5 mg in pm - we discussed about sick days rules, in case she has AI:  If you cannot keep anything down, including your hydrocortisone medication, please go to the emergency room or your primary care physician office to get steroids injected in the muscle or vein. Alternatively, you can inject 100 mg hydrocortisone in the muscle at home.  If you have a fever (more than 100 Fahrenheit) or gastroenteritis with nausea/vomiting and diarrhea, please double the dose of your hydrocortisone for the duration  of the fever or the gastroenteritis.  Do not run out of your hydrocortisone medication.  2. Low estrogen and Progesterone - Patient was found to have low progesterone and estrogen levels a year ago, however, these were checked during the period when her weight was still low that she did not have menstrual cycles. I explained that hypothalamic amenorrhea is a protection mechanism to avoid a possible pregnancy when the body is not ready for it. I believe that she went through of decreased LH, FSH, and 6 hormones which then improved after she started to gain weight and exercise less. The fact that she has now menstrual cycles is the best indicator that her hypothalamic-pituitary-ovarian axis started to work well. However, since she is anxious about this, we decided to check her estradiol and progesterone levels - I explained that estradiol is best checked in the third to fifth day of her menstrual cycle so she will return to have this done around that time - Progesterone, however, is best checked in the mid luteal phase, so we will need to have her back around that time to check the level. I explained that if the progesterone level is  slightly low, I would not recommend supplementation for now, but she may need this when pregnant.  I will see her back if the results are abnormal.  Component     Latest Ref Rng & Units 09/03/2017 09/03/2017 09/03/2017         8:14 AM  9:10 AM  9:36 AM  Estradiol, Free     pg/mL 0.53    Estradiol     pg/mL 29    DHEA-Sulfate, LCMS     ug/dL 161    W960 ACTH     6 - 50 pg/mL 12    Cortisol, Plasma     ug/dL 45.4 09.8 11.9   Results are normal (total estradiol is low but free estradiol is normal). Also cosyntropin stimulation test is normal >> no sign of adrenal insufficiency.  Component     Latest Ref Rng & Units 10/02/2017  Progesterone - midluteal     Ng/mL (normal >6) 3.3   Pg slightly low >> no intervention needed for now, but if plans to get pregnant, may need to have this rechecked and may need supplementation then.  Carlus Pavlov, MD PhD [facility]

## 2017-08-26 NOTE — Patient Instructions (Signed)
Please come back for labs in the 3-5th day of your menstrual cycle. Plan to be here for an hour.  We will schedule another appt if results are abnormal.

## 2017-09-03 ENCOUNTER — Other Ambulatory Visit (INDEPENDENT_AMBULATORY_CARE_PROVIDER_SITE_OTHER): Payer: 59

## 2017-09-03 DIAGNOSIS — R7989 Other specified abnormal findings of blood chemistry: Secondary | ICD-10-CM

## 2017-09-03 DIAGNOSIS — E2839 Other primary ovarian failure: Secondary | ICD-10-CM

## 2017-09-03 DIAGNOSIS — E2740 Unspecified adrenocortical insufficiency: Secondary | ICD-10-CM | POA: Diagnosis not present

## 2017-09-03 LAB — CORTISOL
CORTISOL PLASMA: 12.1 ug/dL
Cortisol, Plasma: 24.5 ug/dL
Cortisol, Plasma: 29.2 ug/dL

## 2017-09-03 MED ORDER — COSYNTROPIN NICU IV SYRINGE 0.25 MG/ML (STANDARD DOSE)
0.2500 mg | Freq: Once | INTRAVENOUS | Status: AC
  Administered 2017-09-03: 0.25 mg via INTRAMUSCULAR

## 2017-09-04 ENCOUNTER — Encounter: Payer: Self-pay | Admitting: Internal Medicine

## 2017-09-06 LAB — ESTRADIOL, FREE
Estradiol, Free: 0.53 pg/mL
Estradiol: 29 pg/mL

## 2017-09-06 LAB — ACTH: C206 ACTH: 12 pg/mL (ref 6–50)

## 2017-09-08 LAB — DHEA-SULFATE, SERUM: DHEA-Sulfate, LCMS: 149 ug/dL

## 2017-09-09 DIAGNOSIS — E2740 Unspecified adrenocortical insufficiency: Secondary | ICD-10-CM | POA: Insufficient documentation

## 2017-09-09 DIAGNOSIS — R7989 Other specified abnormal findings of blood chemistry: Secondary | ICD-10-CM | POA: Insufficient documentation

## 2017-09-12 DIAGNOSIS — Z0000 Encounter for general adult medical examination without abnormal findings: Secondary | ICD-10-CM | POA: Diagnosis not present

## 2017-09-12 DIAGNOSIS — Z3009 Encounter for other general counseling and advice on contraception: Secondary | ICD-10-CM | POA: Diagnosis not present

## 2017-09-12 DIAGNOSIS — Z113 Encounter for screening for infections with a predominantly sexual mode of transmission: Secondary | ICD-10-CM | POA: Diagnosis not present

## 2017-09-12 DIAGNOSIS — R5383 Other fatigue: Secondary | ICD-10-CM | POA: Diagnosis not present

## 2017-09-12 DIAGNOSIS — J3089 Other allergic rhinitis: Secondary | ICD-10-CM | POA: Diagnosis not present

## 2017-09-12 DIAGNOSIS — Z1321 Encounter for screening for nutritional disorder: Secondary | ICD-10-CM | POA: Diagnosis not present

## 2017-09-16 DIAGNOSIS — N914 Secondary oligomenorrhea: Secondary | ICD-10-CM | POA: Diagnosis not present

## 2017-09-16 DIAGNOSIS — Z30018 Encounter for initial prescription of other contraceptives: Secondary | ICD-10-CM | POA: Diagnosis not present

## 2017-10-02 ENCOUNTER — Telehealth: Payer: Self-pay

## 2017-10-02 ENCOUNTER — Telehealth: Payer: Self-pay | Admitting: Internal Medicine

## 2017-10-02 ENCOUNTER — Other Ambulatory Visit: Payer: Self-pay | Admitting: Internal Medicine

## 2017-10-02 ENCOUNTER — Other Ambulatory Visit: Payer: 59

## 2017-10-02 DIAGNOSIS — R7989 Other specified abnormal findings of blood chemistry: Secondary | ICD-10-CM | POA: Diagnosis not present

## 2017-10-02 NOTE — Telephone Encounter (Signed)
Patient called in reference to needing "progesterone" labs done. Patient wanted to know if she could come in today to get these done. I did not see orders in for patient. Please call patient and advise. OK to leave message.

## 2017-10-02 NOTE — Telephone Encounter (Signed)
Yes, ok to come - this is usually checked ~7 days before she restarts her menstrual cycle.

## 2017-10-02 NOTE — Telephone Encounter (Signed)
Called patient and advised that we had the orders in, and for her to come today; she states it is about 5 days now before her menstral cycle. Scheduled for today.

## 2017-10-02 NOTE — Telephone Encounter (Signed)
Routing...

## 2017-10-02 NOTE — Telephone Encounter (Signed)
Please advise. Thank you

## 2017-10-02 NOTE — Telephone Encounter (Signed)
Called patient and advised that we had the orders in, and for her to come today; she states it is about 5 days now before her menstral cycle. Scheduled for today.  

## 2017-10-03 DIAGNOSIS — Z113 Encounter for screening for infections with a predominantly sexual mode of transmission: Secondary | ICD-10-CM | POA: Diagnosis not present

## 2017-10-03 DIAGNOSIS — Z13220 Encounter for screening for lipoid disorders: Secondary | ICD-10-CM | POA: Diagnosis not present

## 2017-10-03 DIAGNOSIS — Z3009 Encounter for other general counseling and advice on contraception: Secondary | ICD-10-CM | POA: Diagnosis not present

## 2017-10-03 DIAGNOSIS — Z0000 Encounter for general adult medical examination without abnormal findings: Secondary | ICD-10-CM | POA: Diagnosis not present

## 2017-10-03 DIAGNOSIS — N914 Secondary oligomenorrhea: Secondary | ICD-10-CM | POA: Diagnosis not present

## 2017-10-03 DIAGNOSIS — Z1321 Encounter for screening for nutritional disorder: Secondary | ICD-10-CM | POA: Diagnosis not present

## 2017-10-03 DIAGNOSIS — R5383 Other fatigue: Secondary | ICD-10-CM | POA: Diagnosis not present

## 2017-10-03 LAB — PROGESTERONE: PROGESTERONE: 3.3 ng/mL

## 2017-10-09 DIAGNOSIS — N914 Secondary oligomenorrhea: Secondary | ICD-10-CM | POA: Diagnosis not present

## 2017-10-21 ENCOUNTER — Telehealth: Payer: Self-pay

## 2017-10-21 ENCOUNTER — Telehealth: Payer: Self-pay | Admitting: Family Medicine

## 2017-10-21 NOTE — Telephone Encounter (Signed)
Called patient and advised of mychart message regarding labs. Patient had no questions at this time.

## 2017-10-21 NOTE — Telephone Encounter (Signed)
Patient called back stated that she wanted to see if we could get the latest lab results seen in mychart, it would not let me results them. Can you check in on this?  Thank you!

## 2017-10-21 NOTE — Telephone Encounter (Signed)
Pt called requesting lab results. Please advise, thank you!  Call pt @ [phone]

## 2017-10-21 NOTE — Telephone Encounter (Signed)
I sent them again.

## 2017-10-22 NOTE — Telephone Encounter (Signed)
Noted, thanks!

## 2017-10-24 DIAGNOSIS — R5381 Other malaise: Secondary | ICD-10-CM | POA: Diagnosis not present

## 2017-10-24 DIAGNOSIS — N915 Oligomenorrhea, unspecified: Secondary | ICD-10-CM | POA: Diagnosis not present

## 2017-11-01 DIAGNOSIS — N915 Oligomenorrhea, unspecified: Secondary | ICD-10-CM | POA: Diagnosis not present

## 2017-11-11 MED FILL — AZITHROMYCIN 250 MG TABLET: 250 | 5 days supply | Qty: 6 | Fill #0

## 2017-11-11 MED FILL — AMOX-CLAV 875-125 MG TABLET: 875-125 | 10 days supply | Qty: 20 | Fill #0

## 2017-12-03 DIAGNOSIS — J0391 Acute recurrent tonsillitis, unspecified: Secondary | ICD-10-CM | POA: Diagnosis not present

## 2017-12-03 DIAGNOSIS — R599 Enlarged lymph nodes, unspecified: Secondary | ICD-10-CM | POA: Diagnosis not present

## 2017-12-23 DIAGNOSIS — J3501 Chronic tonsillitis: Secondary | ICD-10-CM | POA: Diagnosis not present

## 2017-12-31 DIAGNOSIS — J0391 Acute recurrent tonsillitis, unspecified: Secondary | ICD-10-CM

## 2017-12-31 HISTORY — DX: Acute recurrent tonsillitis, unspecified: J03.91

## 2018-01-07 NOTE — H&P (Signed)
  HPI:   Jessica Finley is a 22 y.o. female who presents as a consult Patient.   Referring Provider: Adrian SaranWharton, Courtney Marie*  Chief complaint: Tonsil problems.  HPI: A few years ago she had recurrent tonsillitis, about 5 or 6 times in 1 year while she was in college. One time was strep positive. She got treated multiple times with antibiotics. Since then, she has had trouble with her throat. She has chronic low level soreness in her throat that responds to antibiotics but only briefly. Her right tonsil has been larger than the left ever since, and she has a chronic right side tonsil lymph node that is swollen. She does not smoke or drink. She does not have any reflux risk factors. She does suffer with some allergies and takes Claritin as needed.  PMH/Meds/All/SocHx/FamHx/ROS:   No past medical history on file.  Past Surgical History:  Procedure Laterality Date  . WISDOM TOOTH EXTRACTION   No family history of bleeding disorders, wound healing problems or difficulty with anesthesia.   Social History   Social History  . Marital status: N/A  Spouse name: N/A  . Number of children: N/A  . Years of education: N/A   Occupational History  . Not on file.   Social History Main Topics  . Smoking status: Never Smoker  . Smokeless tobacco: Never Used  . Alcohol use No  . Drug use: No  . Sexual activity: Not on file   Other Topics Concern  . Not on file   Social History Narrative  . No narrative on file   Current Outpatient Prescriptions:  . multivitamin capsule, Take 1 capsule by mouth daily., Disp: , Rfl:  . omega-3/dha/epa/fish oil (OMEGA-3 FATTY ACIDS-FISH OIL) 300-1,000 mg capsule, Take 1 g by mouth daily., Disp: , Rfl:   A complete ROS was performed with pertinent positives/negatives noted in the HPI. The remainder of the ROS are negative.   Physical Exam:   Ht 1.626 m (5\' 4" )  Wt 67.1 kg (148 lb)  BMI 25.40 kg/m   General: Healthy and alert, in no distress,  breathing easily. Normal affect. In a pleasant mood. Head: Normocephalic, atraumatic. No masses, or scars. Eyes: Pupils are equal, and reactive to light. Vision is grossly intact. No spontaneous or gaze nystagmus. Ears: Ear canals are clear. Tympanic membranes are intact, with normal landmarks and the middle ears are clear and healthy. Hearing: Grossly normal. Nose: Nasal cavities are clear with healthy mucosa, no polyps or exudate.Airways are patent. Face: No masses or scars, facial nerve function is symmetric. Oral Cavity: No mucosal abnormalities are noted. Tongue with normal mobility. Dentition appears healthy. Oropharynx: Tonsils are asymmetric, right side about twice the size of the left. There are no mucosal masses identified. Tongue base appears normal and healthy. Larynx/Hypopharynx: deferred Chest: Deferred Neck: No palpable masses, no cervical adenopathy, no thyroid nodules or enlargement. Neuro: Cranial nerves II-XII will normal function. Balance: Normal gate. Other findings: none.  Independent Review of Additional Tests or Records:  none  Procedures:  none  Impression & Plans:  Asymmetrically enlarged tonsil with chronic tonsillitis. She may have cryptic debris that is perpetuating this chronic low level infection. Consider tonsillectomy.Jessica Finley meets the indications for tonsillectomy. Risks and benefits were discussed in detail. All questions were answered. A handout was provided with additional details.

## 2018-01-09 ENCOUNTER — Encounter (HOSPITAL_BASED_OUTPATIENT_CLINIC_OR_DEPARTMENT_OTHER): Payer: Self-pay | Admitting: *Deleted

## 2018-01-09 ENCOUNTER — Other Ambulatory Visit: Payer: Self-pay

## 2018-01-20 ENCOUNTER — Encounter (HOSPITAL_BASED_OUTPATIENT_CLINIC_OR_DEPARTMENT_OTHER): Payer: Self-pay | Admitting: *Deleted

## 2018-01-20 ENCOUNTER — Other Ambulatory Visit: Payer: Self-pay

## 2018-01-27 ENCOUNTER — Encounter (HOSPITAL_BASED_OUTPATIENT_CLINIC_OR_DEPARTMENT_OTHER): Payer: Self-pay | Admitting: Anesthesiology

## 2018-01-27 ENCOUNTER — Other Ambulatory Visit: Payer: Self-pay

## 2018-01-27 ENCOUNTER — Ambulatory Visit (HOSPITAL_BASED_OUTPATIENT_CLINIC_OR_DEPARTMENT_OTHER): Payer: 59 | Admitting: Anesthesiology

## 2018-01-27 ENCOUNTER — Encounter (HOSPITAL_BASED_OUTPATIENT_CLINIC_OR_DEPARTMENT_OTHER)
Admission: RE | Disposition: A | Discharge status: Home / Self Care | Payer: Self-pay | Source: Ambulatory Visit | Attending: Otolaryngology

## 2018-01-27 ENCOUNTER — Ambulatory Visit (HOSPITAL_BASED_OUTPATIENT_CLINIC_OR_DEPARTMENT_OTHER)
Admission: RE | Admit: 2018-01-27 | Discharge: 2018-01-27 | Disposition: A | Discharge status: Home / Self Care | Payer: 59 | Source: Ambulatory Visit | Attending: Otolaryngology | Admitting: Otolaryngology

## 2018-01-27 DIAGNOSIS — J0391 Acute recurrent tonsillitis, unspecified: Secondary | ICD-10-CM | POA: Insufficient documentation

## 2018-01-27 DIAGNOSIS — J3501 Chronic tonsillitis: Secondary | ICD-10-CM | POA: Insufficient documentation

## 2018-01-27 DIAGNOSIS — J039 Acute tonsillitis, unspecified: Secondary | ICD-10-CM | POA: Diagnosis present

## 2018-01-27 HISTORY — DX: Acute recurrent tonsillitis, unspecified: J03.91

## 2018-01-27 HISTORY — PX: TONSILLECTOMY: SHX5217

## 2018-01-27 LAB — POCT PREGNANCY, URINE: PREG TEST UR: NEGATIVE

## 2018-01-27 SURGERY — TONSILLECTOMY
Anesthesia: General | Site: Mouth

## 2018-01-27 MED ORDER — FENTANYL CITRATE (PF) 100 MCG/2ML IJ SOLN
INTRAMUSCULAR | Status: AC
  Filled 2018-01-27: qty 2

## 2018-01-27 MED ORDER — PROPOFOL 500 MG/50ML IV EMUL
INTRAVENOUS | Status: DC | PRN
  Administered 2018-01-27: 50 ug/kg/min via INTRAVENOUS

## 2018-01-27 MED ORDER — FENTANYL CITRATE (PF) 100 MCG/2ML IJ SOLN
25.0000 ug | INTRAMUSCULAR | Status: DC | PRN
  Administered 2018-01-27: 100 ug via INTRAVENOUS
  Administered 2018-01-27: 25 ug via INTRAVENOUS

## 2018-01-27 MED ORDER — MIDAZOLAM HCL 2 MG/2ML IJ SOLN
INTRAMUSCULAR | Status: AC
  Filled 2018-01-27: qty 2

## 2018-01-27 MED ORDER — PROMETHAZINE HCL 25 MG RE SUPP: 25.0000 mg | Freq: Four times a day (QID) | RECTAL | Status: DC | PRN

## 2018-01-27 MED ORDER — FENTANYL CITRATE (PF) 100 MCG/2ML IJ SOLN: 50.0000 ug | INTRAMUSCULAR | Status: DC | PRN

## 2018-01-27 MED ORDER — PROPOFOL 10 MG/ML IV BOLUS
INTRAVENOUS | Status: DC | PRN
  Administered 2018-01-27: 130 mg via INTRAVENOUS
  Administered 2018-01-27: 150 mg via INTRAVENOUS
  Administered 2018-01-27: 20 mg via INTRAVENOUS

## 2018-01-27 MED ORDER — DEXTROSE-NACL 5-0.9 % IV SOLN
INTRAVENOUS | Status: DC
  Administered 2018-01-27: 10:00:00 via INTRAVENOUS

## 2018-01-27 MED ORDER — MIDAZOLAM HCL 2 MG/2ML IJ SOLN
1.0000 mg | INTRAMUSCULAR | Status: DC | PRN
  Administered 2018-01-27: 2 mg via INTRAVENOUS

## 2018-01-27 MED ORDER — FENTANYL CITRATE (PF) 100 MCG/2ML IJ SOLN
INTRAMUSCULAR | Status: AC
Start: 2018-01-27 — End: ?
  Filled 2018-01-27: qty 2

## 2018-01-27 MED ORDER — IBUPROFEN 100 MG/5ML PO SUSP: 400.0000 mg | Freq: Four times a day (QID) | ORAL | Status: DC | PRN

## 2018-01-27 MED ORDER — ONDANSETRON HCL 4 MG/2ML IJ SOLN
INTRAMUSCULAR | Status: DC | PRN
  Administered 2018-01-27: 4 mg via INTRAVENOUS

## 2018-01-27 MED ORDER — LACTATED RINGERS IV SOLN
INTRAVENOUS | Status: DC
  Administered 2018-01-27: 08:00:00 via INTRAVENOUS

## 2018-01-27 MED ORDER — DEXAMETHASONE SODIUM PHOSPHATE 10 MG/ML IJ SOLN
INTRAMUSCULAR | Status: AC
  Filled 2018-01-27: qty 1

## 2018-01-27 MED ORDER — PROMETHAZINE HCL 25 MG RE SUPP: 25.0000 mg | Freq: Four times a day (QID) | RECTAL | 1 refills | Status: AC | PRN

## 2018-01-27 MED ORDER — HYDROCODONE-ACETAMINOPHEN 7.5-325 MG/15ML PO SOLN: 15.0000 mL | Freq: Four times a day (QID) | ORAL | 0 refills | Status: AC | PRN

## 2018-01-27 MED ORDER — HYDROCODONE-ACETAMINOPHEN 7.5-325 MG/15ML PO SOLN
10.0000 mL | ORAL | Status: DC | PRN
  Administered 2018-01-27: 15 mL via ORAL
  Filled 2018-01-27: qty 15

## 2018-01-27 MED ORDER — PROPOFOL 500 MG/50ML IV EMUL
INTRAVENOUS | Status: AC
  Filled 2018-01-27: qty 50

## 2018-01-27 MED ORDER — ONDANSETRON HCL 4 MG/2ML IJ SOLN
INTRAMUSCULAR | Status: AC
  Filled 2018-01-27: qty 2

## 2018-01-27 MED ORDER — PHENOL 1.4 % MT LIQD: 1.0000 | OROMUCOSAL | Status: DC | PRN

## 2018-01-27 MED ORDER — BACITRACIN ZINC 500 UNIT/GM EX OINT: 1.0000 "application " | TOPICAL_OINTMENT | Freq: Three times a day (TID) | CUTANEOUS | Status: DC

## 2018-01-27 MED ORDER — OXYCODONE HCL 5 MG/5ML PO SOLN: 5.0000 mg | Freq: Once | ORAL | Status: DC | PRN

## 2018-01-27 MED ORDER — SUCCINYLCHOLINE CHLORIDE 20 MG/ML IJ SOLN
INTRAMUSCULAR | Status: DC | PRN
  Administered 2018-01-27 (×2): 60 mg via INTRAVENOUS

## 2018-01-27 MED ORDER — SCOPOLAMINE 1 MG/3DAYS TD PT72: 1.0000 | MEDICATED_PATCH | Freq: Once | TRANSDERMAL | Status: DC | PRN

## 2018-01-27 MED ORDER — LIDOCAINE 2% (20 MG/ML) 5 ML SYRINGE
INTRAMUSCULAR | Status: DC | PRN
  Administered 2018-01-27: 60 mg via INTRAVENOUS

## 2018-01-27 MED ORDER — DEXAMETHASONE SODIUM PHOSPHATE 4 MG/ML IJ SOLN
INTRAMUSCULAR | Status: DC | PRN
  Administered 2018-01-27: 10 mg via INTRAVENOUS

## 2018-01-27 MED ORDER — OXYCODONE HCL 5 MG PO TABS: 5.0000 mg | ORAL_TABLET | Freq: Once | ORAL | Status: DC | PRN

## 2018-01-27 MED ORDER — PROMETHAZINE HCL 25 MG PO TABS: 25.0000 mg | ORAL_TABLET | Freq: Four times a day (QID) | ORAL | Status: DC | PRN

## 2018-01-27 MED FILL — HYDROCOD-APAP 7.5-325/15ML: 7.5-325 | 7 days supply | Qty: 420 | Fill #0

## 2018-01-27 SURGICAL SUPPLY — 26 items
CANISTER SUCT 1200ML W/VALVE (MISCELLANEOUS) ×2 IMPLANT
CATH ROBINSON RED A/P 12FR (CATHETERS) ×2 IMPLANT
COAGULATOR SUCT SWTCH 10FR 6 (ELECTROSURGICAL) ×2 IMPLANT
COVER BACK TABLE 60X90IN (DRAPES) ×2 IMPLANT
COVER MAYO STAND STRL (DRAPES) ×2 IMPLANT
ELECT COATED BLADE 2.86 ST (ELECTRODE) ×2 IMPLANT
ELECT REM PT RETURN 9FT ADLT (ELECTROSURGICAL) ×2
ELECT REM PT RETURN 9FT PED (ELECTROSURGICAL)
ELECTRODE REM PT RETRN 9FT PED (ELECTROSURGICAL) IMPLANT
ELECTRODE REM PT RTRN 9FT ADLT (ELECTROSURGICAL) ×1 IMPLANT
GAUZE SPONGE 4X4 12PLY STRL LF (GAUZE/BANDAGES/DRESSINGS) ×2 IMPLANT
GLOVE ECLIPSE 7.5 STRL STRAW (GLOVE) ×2 IMPLANT
GOWN STRL REUS W/ TWL LRG LVL3 (GOWN DISPOSABLE) ×2 IMPLANT
GOWN STRL REUS W/TWL LRG LVL3 (GOWN DISPOSABLE) ×2
MARKER SKIN DUAL TIP RULER LAB (MISCELLANEOUS) IMPLANT
NS IRRIG 1000ML POUR BTL (IV SOLUTION) ×2 IMPLANT
PENCIL FOOT CONTROL (ELECTRODE) ×2 IMPLANT
SHEET MEDIUM DRAPE 40X70 STRL (DRAPES) ×2 IMPLANT
SOLUTION BUTLER CLEAR DIP (MISCELLANEOUS) ×2 IMPLANT
SPONGE TONSIL 1 RF SGL (DISPOSABLE) IMPLANT
SPONGE TONSIL 1.25 RF SGL STRG (GAUZE/BANDAGES/DRESSINGS) IMPLANT
SYR BULB 3OZ (MISCELLANEOUS) ×2 IMPLANT
TOWEL OR 17X24 6PK STRL BLUE (TOWEL DISPOSABLE) ×2 IMPLANT
TUBE CONNECTING 20X1/4 (TUBING) ×2 IMPLANT
TUBE SALEM SUMP 12R W/ARV (TUBING) IMPLANT
TUBE SALEM SUMP 16 FR W/ARV (TUBING) ×2 IMPLANT

## 2018-01-27 NOTE — Interval H&P Note (Signed)
History and Physical Interval Note:  01/27/2018 8:13 AM  Jessica RussellSara Moradi  has presented today for surgery, with the diagnosis of recurrent tonsilitis  The various methods of treatment have been discussed with the patient and family. After consideration of risks, benefits and other options for treatment, the patient has consented to  Procedure(s): TONSILLECTOMY (N/A) as a surgical intervention .  The patient's history has been reviewed, patient examined, no change in status, stable for surgery.  I have reviewed the patient's chart and labs.  Questions were answered to the patient's satisfaction.     Serena ColonelJefry Kishia Shackett

## 2018-01-27 NOTE — Transfer of Care (Signed)
Immediate Anesthesia Transfer of Care Note  Patient: Jessica Finley  Procedure(s) Performed: TONSILLECTOMY (N/A Mouth)  Patient Location: PACU  Anesthesia Type:General  Level of Consciousness: sedated  Airway & Oxygen Therapy: Patient Spontanous Breathing and Patient connected to face mask oxygen  Post-op Assessment: Report given to RN and Post -op Vital signs reviewed and stable  Post vital signs: Reviewed and stable  Last Vitals:  Vitals:   01/27/18 0731  BP: 113/66  Pulse: 73  Resp: 20  Temp: 36.8 C  SpO2: 100%    Last Pain:  Vitals:   01/27/18 0731  TempSrc: Oral         Complications: No apparent anesthesia complications

## 2018-01-27 NOTE — Op Note (Signed)
01/27/2018  9:12 AM  PATIENT:  Jessica RussellSara Giannelli  22 y.o. female  PRE-OPERATIVE DIAGNOSIS:  recurrent tonsilitis  POST-OPERATIVE DIAGNOSIS:  recurent tonsilitis  PROCEDURE:  Procedure(s): TONSILLECTOMY  SURGEON:  Surgeon(s): Serena Colonelosen, Tysean Vandervliet, MD  ANESTHESIA:   General  COUNTS: Correct   DICTATION: The patient was taken to the operating room and placed on the operating table in the supine position. Following induction of general endotracheal anesthesia, the table was turned and the patient was draped in a standard fashion. A Crowe-Davis mouthgag was inserted into the oral cavity and used to retract the tongue and mandible, then attached to the Mayo stand.  The tonsillectomy was then performed using electrocautery dissection, carefully dissecting the avascular plane between the capsule and constrictor muscles. Cautery was used for completion of hemostasis. The tonsils were moderately enlarged , and were discarded.  The pharynx was irrigated with saline and suctioned. An oral gastric tube was used to aspirate the contents of the stomach. The patient was then awakened from anesthesia and transferred to PACU in stable condition.   PATIENT DISPOSITION:  To PACA, stable

## 2018-01-27 NOTE — Anesthesia Postprocedure Evaluation (Signed)
Anesthesia Post Note  Patient: Jessica RussellSara Finley  Procedure(s) Performed: TONSILLECTOMY (N/A Mouth)     Patient location during evaluation: PACU Anesthesia Type: General Level of consciousness: awake and alert Pain management: pain level controlled Vital Signs Assessment: post-procedure vital signs reviewed and stable Respiratory status: spontaneous breathing, nonlabored ventilation, respiratory function stable and patient connected to nasal cannula oxygen Cardiovascular status: blood pressure returned to baseline and stable Postop Assessment: no apparent nausea or vomiting Anesthetic complications: no    Last Vitals:  Vitals:   01/27/18 0943 01/27/18 0949  BP: 112/84 127/71  Pulse: 72 76  Resp: 16 16  Temp:  36.5 C  SpO2: 100% 100%    Last Pain:  Vitals:   01/27/18 1037  TempSrc:   PainSc: 7                  Yee Joss

## 2018-01-27 NOTE — Discharge Instructions (Signed)
Tonsillectomy, Adult, Care After °This sheet gives you information about how to care for yourself after your procedure. Your doctor may also give you more specific instructions. If you have problems or questions, contact your doctor. °Follow these instructions at home: °Eating and drinking °· Follow instructions from your doctor about eating and drinking. °· For many days after surgery, choose foods that are soft and cold. Examples are: °? Gelatin. °? Sherbet. °? Ice cream. °? Frozen ice pops. °· If you have an upset stomach (are nauseous), choose liquids that are cold and that you can see through (clear liquids). Examples are water and apple juice without pulp. You can try thick liquids and soft foods when you can eat without throwing up (vomiting) and without too much pain. Examples are: °? Creamed soups. °? Soft, warm cereals, such as oatmeal or hot wheat cereal. °? Milk. °? Mashed potatoes. °? Applesauce. °· Drink enough fluid to keep your pee (urine) clear or pale yellow. °Driving °· Do not drive for 24 hours if you were given a medicine to help you relax (sedative). °· Do not drive or use heavy machinery while taking prescription pain medicine or until your health care provider approves. °General instructions °· Rest. °· Keep your head raised (elevated) when lying down. °· Take medicines only as told by your doctor. These include over-the-counter medicines and prescription medicines. °· Do not use mouthwashes until your doctor says it is okay. °· Gargle only as told by your doctor. °· Stay away from people who are sick. °Contact a doctor if: °· Your pain gets worse or medicines do not help. °· You have a fever. °· You have a rash. °· You feel light-headed or you pass out (faint). °· You cannot swallow even a little liquid or spit (saliva). °· Your pee is very dark. °Get help right away if: °· You have trouble breathing. °· You bleed bright red blood from your throat. °· You throw up bright red  blood. °Summary °· Follow instructions from your doctor about what you cannot eat or drink. For many days after surgery, choose foods that are soft and cold. °· Talk with your doctor about how to keep your pain under control. This can help you rest and swallow better. °· Get help right away if you bleed bright red blood from your throat or you throw up bright red blood. °This information is not intended to replace advice given to you by your health care provider. Make sure you discuss any questions you have with your health care provider. °Document Released: 01/19/2011 Document Revised: 11/09/2016 Document Reviewed: 11/09/2016 °Elsevier Interactive Patient Education © 2017 [employer] ° ° ° ° °Post Anesthesia Home Care Instructions ° °Activity: °Get plenty of rest for the remainder of the day. A responsible individual must stay with you for 24 hours following the procedure.  °For the next 24 hours, DO NOT: °-Drive a car °-Operate machinery °-Drink alcoholic beverages °-Take any medication unless instructed by your physician °-Make any legal decisions or sign important papers. ° °Meals: °Start with liquid foods such as gelatin or soup. Progress to regular foods as tolerated. Avoid greasy, spicy, heavy foods. If nausea and/or vomiting occur, drink only clear liquids until the nausea and/or vomiting subsides. Call your physician if vomiting continues. ° °Special Instructions/Symptoms: °Your throat may feel dry or sore from the anesthesia or the breathing tube placed in your throat during surgery. If this causes discomfort, gargle with warm salt water. The discomfort should disappear within   salt water. The discomfort should disappear within 24 hours.  If you had a scopolamine patch placed behind your ear for the management of post- operative nausea and/or vomiting:  1. The medication in the patch is effective for 72 hours, after which it should be removed.  Wrap patch in a tissue and discard in the trash. Wash hands thoroughly with soap and water. 2. You may  remove the patch earlier than 72 hours if you experience unpleasant side effects which may include dry mouth, dizziness or visual disturbances. 3. Avoid touching the patch. Wash your hands with soap and water after contact with the patch.

## 2018-01-27 NOTE — Anesthesia Preprocedure Evaluation (Signed)
Anesthesia Evaluation  Patient identified by MRN, date of birth, ID band Patient awake    Reviewed: Allergy & Precautions, NPO status , Patient's Chart, lab work & pertinent test results  History of Anesthesia Complications Negative for: history of anesthetic complications  Airway Mallampati: I  TM Distance: >3 FB Neck ROM: Full    Dental  (+) Teeth Intact   Pulmonary neg pulmonary ROS,    breath sounds clear to auscultation       Cardiovascular negative cardio ROS   Rhythm:Regular     Neuro/Psych negative neurological ROS  negative psych ROS   GI/Hepatic negative GI ROS, Neg liver ROS,   Endo/Other  negative endocrine ROS  Renal/GU negative Renal ROS     Musculoskeletal   Abdominal   Peds  Hematology negative hematology ROS (+)   Anesthesia Other Findings   Reproductive/Obstetrics                             Anesthesia Physical Anesthesia Plan  ASA: I  Anesthesia Plan: General   Post-op Pain Management:    Induction: Intravenous  PONV Risk Score and Plan: 3 and Ondansetron and Dexamethasone  Airway Management Planned: Oral ETT  Additional Equipment: None  Intra-op Plan:   Post-operative Plan: Extubation in OR  Informed Consent: I have reviewed the patients History and Physical, chart, labs and discussed the procedure including the risks, benefits and alternatives for the proposed anesthesia with the patient or authorized representative who has indicated his/her understanding and acceptance.   Dental advisory given  Plan Discussed with: CRNA and Surgeon  Anesthesia Plan Comments:         Anesthesia Quick Evaluation

## 2018-01-27 NOTE — Anesthesia Procedure Notes (Signed)
Procedure Name: Intubation Date/Time: 01/27/2018 8:48 AM Performed by: Maryella Shivers, CRNA Pre-anesthesia Checklist: Patient identified, Emergency Drugs available, Suction available and Patient being monitored Patient Re-evaluated:Patient Re-evaluated prior to induction Oxygen Delivery Method: Circle system utilized Preoxygenation: Pre-oxygenation with 100% oxygen Induction Type: IV induction Ventilation: Mask ventilation without difficulty Laryngoscope Size: Mac and 3 Grade View: Grade I Tube type: Oral Tube size: 7.0 mm Number of attempts: 1 Airway Equipment and Method: Stylet and Oral airway Placement Confirmation: ETT inserted through vocal cords under direct vision,  positive ETCO2 and breath sounds checked- equal and bilateral Secured at: 20 cm Tube secured with: Tape Dental Injury: Teeth and Oropharynx as per pre-operative assessment

## 2018-01-28 ENCOUNTER — Encounter (HOSPITAL_BASED_OUTPATIENT_CLINIC_OR_DEPARTMENT_OTHER): Payer: Self-pay | Admitting: Otolaryngology

## 2018-02-14 DIAGNOSIS — R1030 Lower abdominal pain, unspecified: Secondary | ICD-10-CM | POA: Diagnosis not present

## 2018-02-14 DIAGNOSIS — K5900 Constipation, unspecified: Secondary | ICD-10-CM | POA: Diagnosis not present

## 2018-02-14 DIAGNOSIS — R102 Pelvic and perineal pain: Secondary | ICD-10-CM | POA: Diagnosis not present

## 2018-03-05 ENCOUNTER — Other Ambulatory Visit (HOSPITAL_COMMUNITY): Payer: Self-pay | Admitting: Physician Assistant

## 2018-03-05 DIAGNOSIS — R1032 Left lower quadrant pain: Secondary | ICD-10-CM

## 2018-03-05 DIAGNOSIS — R102 Pelvic and perineal pain: Secondary | ICD-10-CM

## 2018-03-05 DIAGNOSIS — R1031 Right lower quadrant pain: Secondary | ICD-10-CM

## 2018-03-06 ENCOUNTER — Ambulatory Visit (HOSPITAL_COMMUNITY): Admission: RE | Admit: 2018-03-06 | Payer: 59 | Source: Ambulatory Visit

## 2018-03-13 ENCOUNTER — Ambulatory Visit (HOSPITAL_COMMUNITY)
Admission: RE | Admit: 2018-03-13 | Discharge: 2018-03-13 | Disposition: A | Discharge status: Home / Self Care | Payer: 59 | Source: Ambulatory Visit | Attending: Physician Assistant | Admitting: Physician Assistant

## 2018-03-13 DIAGNOSIS — R1031 Right lower quadrant pain: Secondary | ICD-10-CM | POA: Diagnosis not present

## 2018-03-13 DIAGNOSIS — R102 Pelvic and perineal pain: Secondary | ICD-10-CM | POA: Diagnosis not present

## 2018-03-13 DIAGNOSIS — R1032 Left lower quadrant pain: Secondary | ICD-10-CM | POA: Diagnosis not present

## 2018-03-13 DIAGNOSIS — R140 Abdominal distension (gaseous): Secondary | ICD-10-CM | POA: Diagnosis not present

## 2018-03-13 MED ORDER — IOPAMIDOL (ISOVUE-300) INJECTION 61%
100.0000 mL | Freq: Once | INTRAVENOUS | Status: AC | PRN
  Administered 2018-03-13: 100 mL via INTRAVENOUS

## 2018-03-24 DIAGNOSIS — K5900 Constipation, unspecified: Secondary | ICD-10-CM | POA: Diagnosis not present

## 2018-03-24 DIAGNOSIS — R1030 Lower abdominal pain, unspecified: Secondary | ICD-10-CM | POA: Diagnosis not present

## 2018-04-11 DIAGNOSIS — R1030 Lower abdominal pain, unspecified: Secondary | ICD-10-CM | POA: Diagnosis not present

## 2018-04-11 DIAGNOSIS — K5900 Constipation, unspecified: Secondary | ICD-10-CM | POA: Diagnosis not present

## 2018-04-30 DIAGNOSIS — R140 Abdominal distension (gaseous): Secondary | ICD-10-CM | POA: Diagnosis not present

## 2018-04-30 DIAGNOSIS — R109 Unspecified abdominal pain: Secondary | ICD-10-CM | POA: Diagnosis not present

## 2018-05-21 IMAGING — CT CT ABD-PELV W/ CM
2 of 4 series · 17 of 46 positions shown, 19 images · IV contrast (ISOVUE)
Comparison: None.

CLINICAL DATA: Two-month history of abdominal cramping and
bloating.

EXAM:
CT ABDOMEN AND PELVIS WITH CONTRAST
TECHNIQUE: Multidetector CT imaging of the abdomen and pelvis was performed
using the standard protocol following bolus administration of
intravenous contrast.
CONTRAST:  100mL 0HWEPT-7HH IOPAMIDOL (0HWEPT-7HH) INJECTION 61%

[Series 2: axial st · axial · 0.85mm/px · z∈[-43,+332]mm · 14 of 83 slices shown, 16 images]
[im 4/83  soft-tissue]
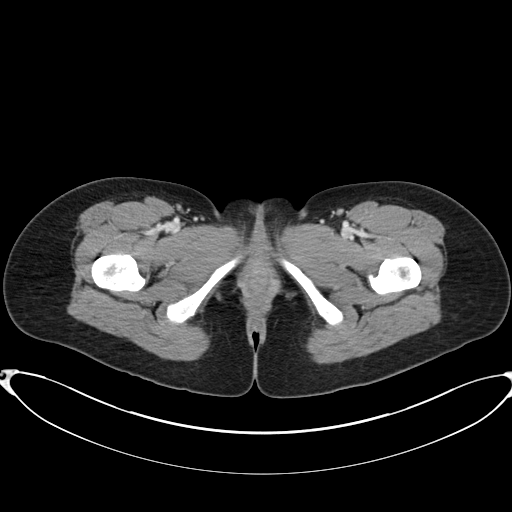
[im 4/83  bone]
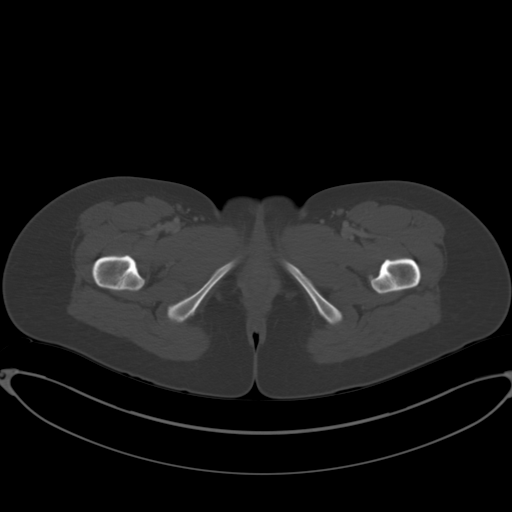
[im 12/83  soft-tissue]
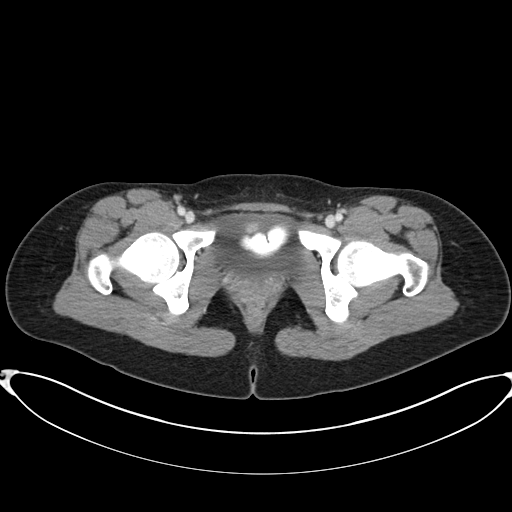
[im 16/83  soft-tissue]
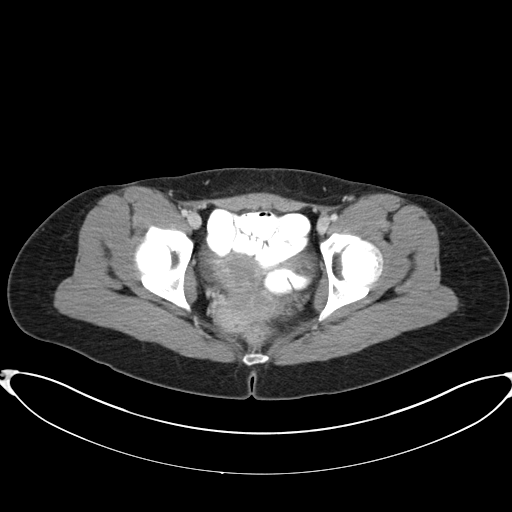
[im 24/83  soft-tissue]
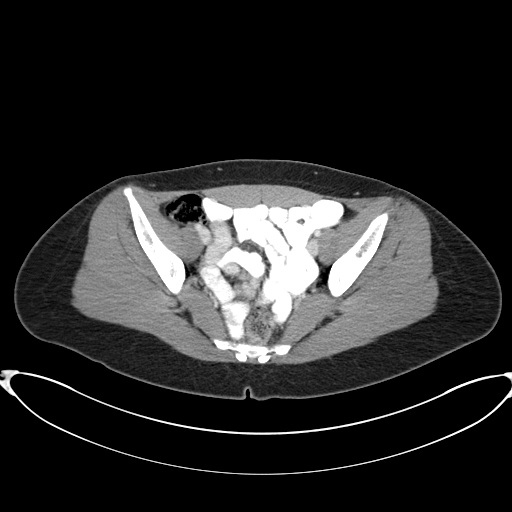
[im 28/83  soft-tissue]
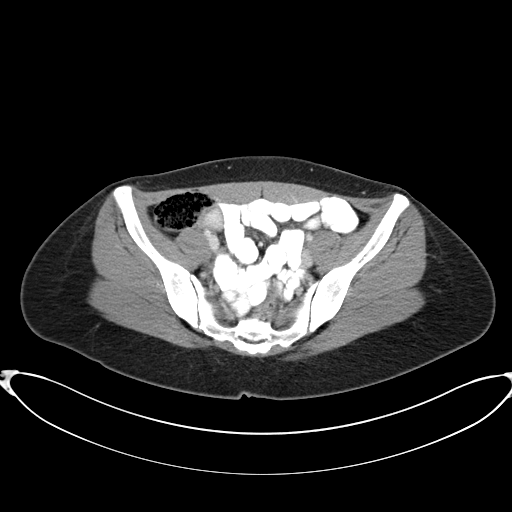
[im 32/83  soft-tissue]
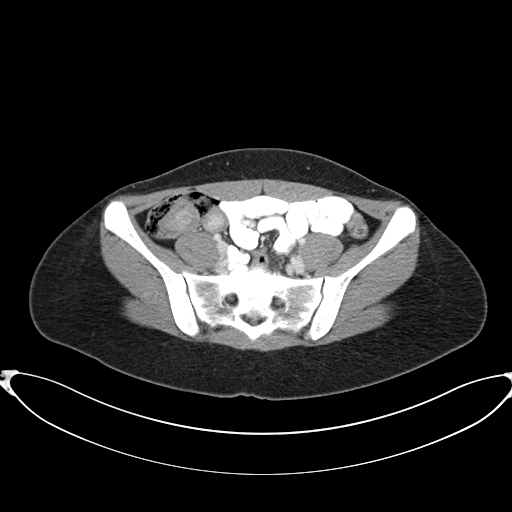
[im 40/83  soft-tissue]
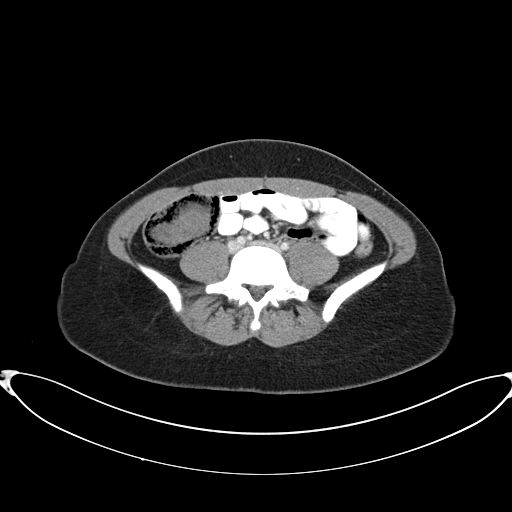
[im 43/83  soft-tissue]
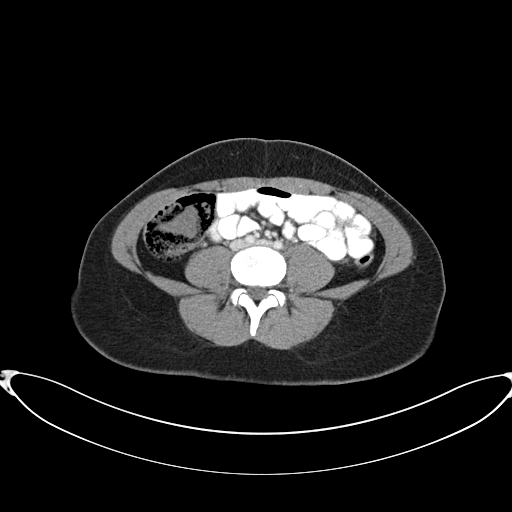
[im 51/83  soft-tissue]
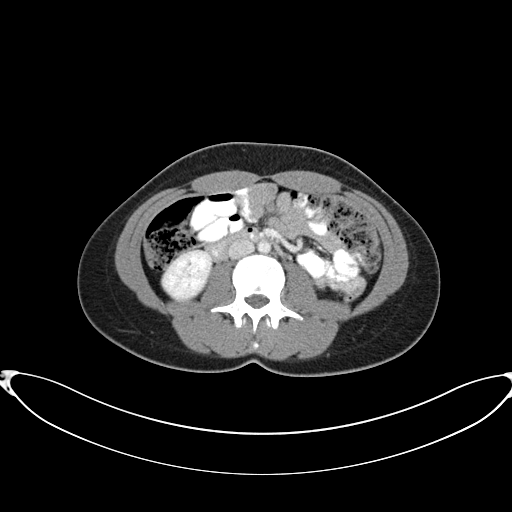
[im 51/83  bone]
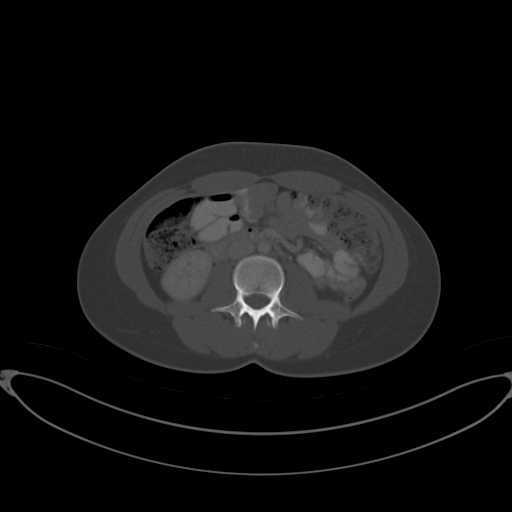
[im 55/83  soft-tissue]
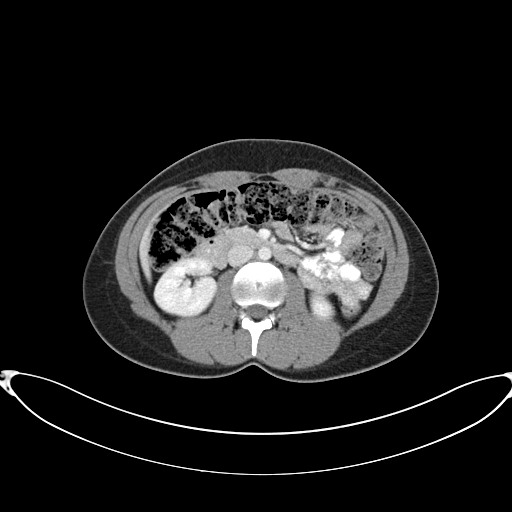
[im 63/83  soft-tissue]
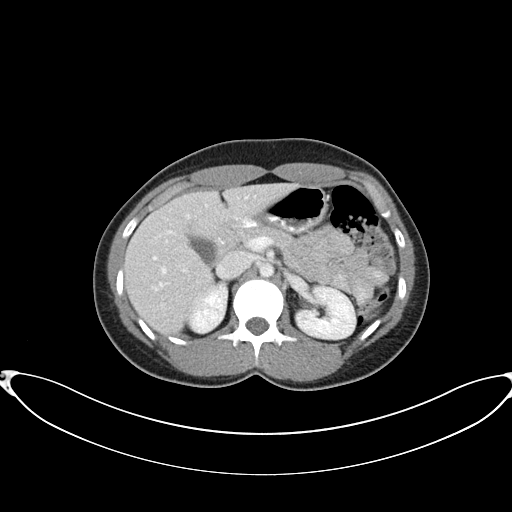
[im 67/83  soft-tissue]
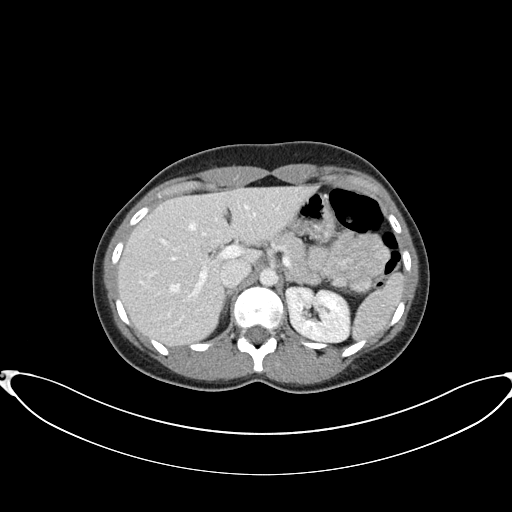
[im 71/83  soft-tissue]
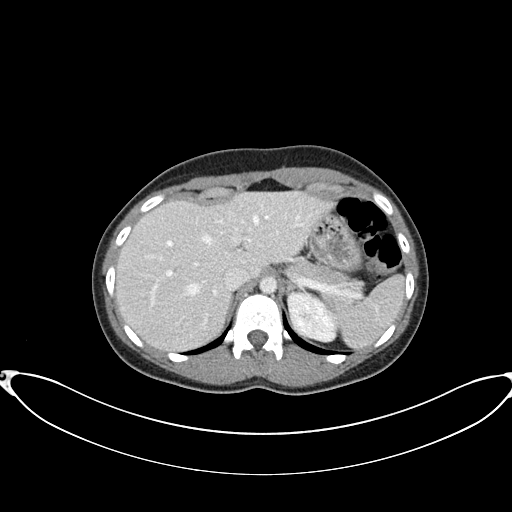
[im 79/83  soft-tissue]
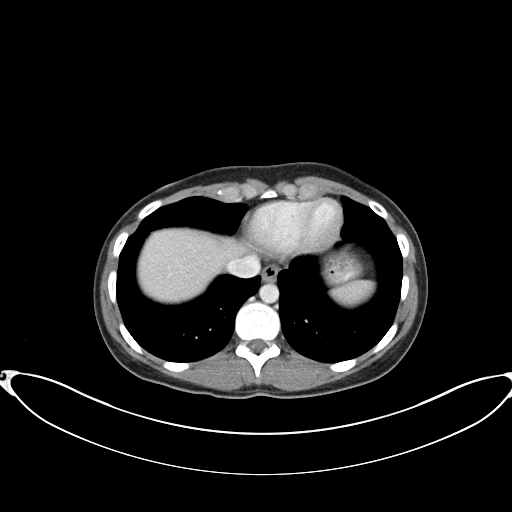

[Series 5: coronal st · coronal · 0.79mm/px · 3 of 80 slices shown]
[im 27/80  soft-tissue]
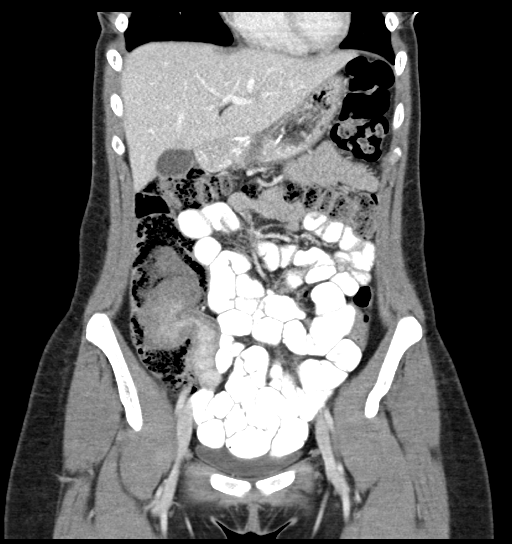
[im 36/80  soft-tissue]
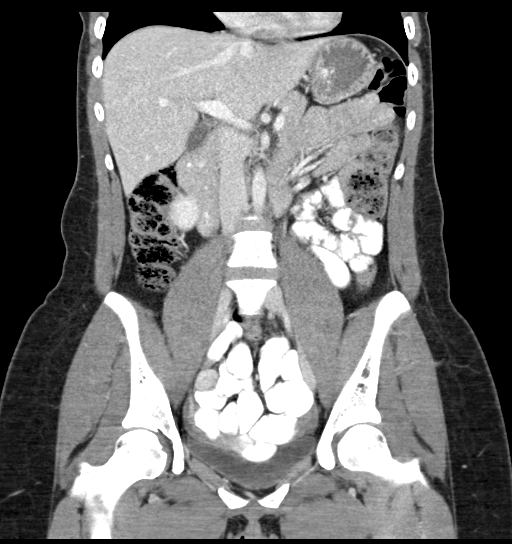
[im 44/80  soft-tissue]
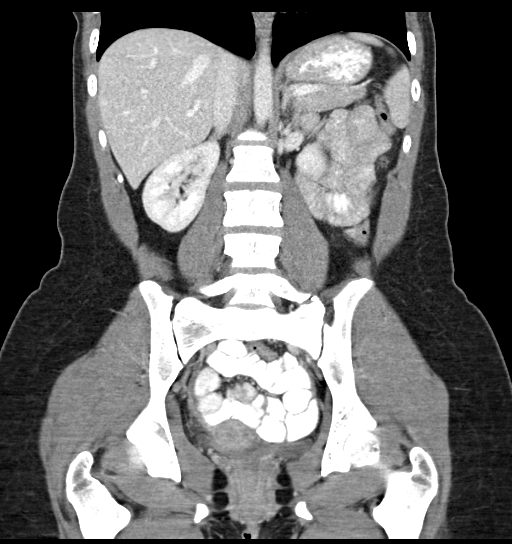

[17 of 46 positions shown; findings below may reference images not displayed]

FINDINGS: Lower chest: No acute abnormality.

Hepatobiliary: No focal liver abnormality is seen. No gallstones,
gallbladder wall thickening, or biliary dilatation.

Pancreas: Unremarkable. No pancreatic ductal dilatation or
surrounding inflammatory changes.

Spleen: Normal in size without focal abnormality.

Adrenals/Urinary Tract: Adrenal glands are unremarkable. Kidneys are
normal, without renal calculi, focal lesion, or hydronephrosis.
Bladder is unremarkable.

Stomach/Bowel: Stomach is within normal limits. Appendix appears
normal. No evidence of bowel wall thickening, distention, or
inflammatory changes. Moderate right-sided colonic stool burden.

Vascular/Lymphatic: No significant vascular findings are present. No
enlarged abdominal or pelvic lymph nodes.

Reproductive: Uterus and bilateral adnexa are unremarkable.

Other: No abdominal wall hernia or abnormality. No abdominopelvic
ascites.

Musculoskeletal: No acute or significant osseous findings. Partial
sacralization of L5 on the left.
IMPRESSION: 1. Normal contrast-enhanced CT of the abdomen and pelvis.

## 2018-05-27 DIAGNOSIS — Z9109 Other allergy status, other than to drugs and biological substances: Secondary | ICD-10-CM | POA: Diagnosis not present

## 2018-07-24 DIAGNOSIS — M9901 Segmental and somatic dysfunction of cervical region: Secondary | ICD-10-CM | POA: Diagnosis not present

## 2018-07-24 DIAGNOSIS — M546 Pain in thoracic spine: Secondary | ICD-10-CM | POA: Diagnosis not present

## 2018-07-24 DIAGNOSIS — K219 Gastro-esophageal reflux disease without esophagitis: Secondary | ICD-10-CM | POA: Diagnosis not present

## 2018-07-24 DIAGNOSIS — M542 Cervicalgia: Secondary | ICD-10-CM | POA: Diagnosis not present

## 2018-07-24 DIAGNOSIS — M9902 Segmental and somatic dysfunction of thoracic region: Secondary | ICD-10-CM | POA: Diagnosis not present

## 2018-07-29 DIAGNOSIS — M9902 Segmental and somatic dysfunction of thoracic region: Secondary | ICD-10-CM | POA: Diagnosis not present

## 2018-07-29 DIAGNOSIS — M9901 Segmental and somatic dysfunction of cervical region: Secondary | ICD-10-CM | POA: Diagnosis not present

## 2018-07-29 DIAGNOSIS — M546 Pain in thoracic spine: Secondary | ICD-10-CM | POA: Diagnosis not present

## 2018-07-29 DIAGNOSIS — M542 Cervicalgia: Secondary | ICD-10-CM | POA: Diagnosis not present

## 2018-07-30 DIAGNOSIS — M546 Pain in thoracic spine: Secondary | ICD-10-CM | POA: Diagnosis not present

## 2018-07-30 DIAGNOSIS — M542 Cervicalgia: Secondary | ICD-10-CM | POA: Diagnosis not present

## 2018-07-30 DIAGNOSIS — M9901 Segmental and somatic dysfunction of cervical region: Secondary | ICD-10-CM | POA: Diagnosis not present

## 2018-07-30 DIAGNOSIS — M9902 Segmental and somatic dysfunction of thoracic region: Secondary | ICD-10-CM | POA: Diagnosis not present

## 2018-08-04 DIAGNOSIS — M9901 Segmental and somatic dysfunction of cervical region: Secondary | ICD-10-CM | POA: Diagnosis not present

## 2018-08-04 DIAGNOSIS — M542 Cervicalgia: Secondary | ICD-10-CM | POA: Diagnosis not present

## 2018-08-04 DIAGNOSIS — M9902 Segmental and somatic dysfunction of thoracic region: Secondary | ICD-10-CM | POA: Diagnosis not present

## 2018-08-04 DIAGNOSIS — M546 Pain in thoracic spine: Secondary | ICD-10-CM | POA: Diagnosis not present

## 2018-08-06 DIAGNOSIS — M542 Cervicalgia: Secondary | ICD-10-CM | POA: Diagnosis not present

## 2018-08-06 DIAGNOSIS — M9901 Segmental and somatic dysfunction of cervical region: Secondary | ICD-10-CM | POA: Diagnosis not present

## 2018-08-06 DIAGNOSIS — M546 Pain in thoracic spine: Secondary | ICD-10-CM | POA: Diagnosis not present

## 2018-08-06 DIAGNOSIS — M9902 Segmental and somatic dysfunction of thoracic region: Secondary | ICD-10-CM | POA: Diagnosis not present

## 2018-08-12 DIAGNOSIS — M9902 Segmental and somatic dysfunction of thoracic region: Secondary | ICD-10-CM | POA: Diagnosis not present

## 2018-08-12 DIAGNOSIS — M542 Cervicalgia: Secondary | ICD-10-CM | POA: Diagnosis not present

## 2018-08-12 DIAGNOSIS — M9901 Segmental and somatic dysfunction of cervical region: Secondary | ICD-10-CM | POA: Diagnosis not present

## 2018-08-12 DIAGNOSIS — M546 Pain in thoracic spine: Secondary | ICD-10-CM | POA: Diagnosis not present

## 2018-08-14 DIAGNOSIS — M546 Pain in thoracic spine: Secondary | ICD-10-CM | POA: Diagnosis not present

## 2018-08-14 DIAGNOSIS — M9902 Segmental and somatic dysfunction of thoracic region: Secondary | ICD-10-CM | POA: Diagnosis not present

## 2018-08-14 DIAGNOSIS — M9901 Segmental and somatic dysfunction of cervical region: Secondary | ICD-10-CM | POA: Diagnosis not present

## 2018-08-14 DIAGNOSIS — M542 Cervicalgia: Secondary | ICD-10-CM | POA: Diagnosis not present

## 2018-08-18 DIAGNOSIS — M9902 Segmental and somatic dysfunction of thoracic region: Secondary | ICD-10-CM | POA: Diagnosis not present

## 2018-08-18 DIAGNOSIS — M542 Cervicalgia: Secondary | ICD-10-CM | POA: Diagnosis not present

## 2018-08-18 DIAGNOSIS — M546 Pain in thoracic spine: Secondary | ICD-10-CM | POA: Diagnosis not present

## 2018-08-18 DIAGNOSIS — M9901 Segmental and somatic dysfunction of cervical region: Secondary | ICD-10-CM | POA: Diagnosis not present

## 2018-08-20 DIAGNOSIS — M9901 Segmental and somatic dysfunction of cervical region: Secondary | ICD-10-CM | POA: Diagnosis not present

## 2018-08-20 DIAGNOSIS — M542 Cervicalgia: Secondary | ICD-10-CM | POA: Diagnosis not present

## 2018-08-20 DIAGNOSIS — M9902 Segmental and somatic dysfunction of thoracic region: Secondary | ICD-10-CM | POA: Diagnosis not present

## 2018-08-20 DIAGNOSIS — M546 Pain in thoracic spine: Secondary | ICD-10-CM | POA: Diagnosis not present

## 2018-08-25 DIAGNOSIS — M9901 Segmental and somatic dysfunction of cervical region: Secondary | ICD-10-CM | POA: Diagnosis not present

## 2018-08-25 DIAGNOSIS — M9902 Segmental and somatic dysfunction of thoracic region: Secondary | ICD-10-CM | POA: Diagnosis not present

## 2018-08-25 DIAGNOSIS — M546 Pain in thoracic spine: Secondary | ICD-10-CM | POA: Diagnosis not present

## 2018-08-25 DIAGNOSIS — M542 Cervicalgia: Secondary | ICD-10-CM | POA: Diagnosis not present

## 2018-08-28 DIAGNOSIS — M542 Cervicalgia: Secondary | ICD-10-CM | POA: Diagnosis not present

## 2018-08-28 DIAGNOSIS — M9902 Segmental and somatic dysfunction of thoracic region: Secondary | ICD-10-CM | POA: Diagnosis not present

## 2018-08-28 DIAGNOSIS — M9901 Segmental and somatic dysfunction of cervical region: Secondary | ICD-10-CM | POA: Diagnosis not present

## 2018-08-28 DIAGNOSIS — M546 Pain in thoracic spine: Secondary | ICD-10-CM | POA: Diagnosis not present

## 2018-09-04 DIAGNOSIS — M542 Cervicalgia: Secondary | ICD-10-CM | POA: Diagnosis not present

## 2018-09-04 DIAGNOSIS — M9901 Segmental and somatic dysfunction of cervical region: Secondary | ICD-10-CM | POA: Diagnosis not present

## 2018-09-04 DIAGNOSIS — M9902 Segmental and somatic dysfunction of thoracic region: Secondary | ICD-10-CM | POA: Diagnosis not present

## 2018-09-04 DIAGNOSIS — M546 Pain in thoracic spine: Secondary | ICD-10-CM | POA: Diagnosis not present

## 2018-09-18 DIAGNOSIS — K5900 Constipation, unspecified: Secondary | ICD-10-CM | POA: Diagnosis not present

## 2018-09-18 DIAGNOSIS — Z0000 Encounter for general adult medical examination without abnormal findings: Secondary | ICD-10-CM | POA: Diagnosis not present

## 2018-10-22 DIAGNOSIS — R102 Pelvic and perineal pain: Secondary | ICD-10-CM | POA: Diagnosis not present

## 2018-10-22 DIAGNOSIS — N926 Irregular menstruation, unspecified: Secondary | ICD-10-CM | POA: Diagnosis not present

## 2018-10-23 DIAGNOSIS — N926 Irregular menstruation, unspecified: Secondary | ICD-10-CM | POA: Diagnosis not present

## 2018-11-14 DIAGNOSIS — R102 Pelvic and perineal pain: Secondary | ICD-10-CM | POA: Diagnosis not present

## 2018-11-14 DIAGNOSIS — N941 Unspecified dyspareunia: Secondary | ICD-10-CM | POA: Diagnosis not present

## 2018-11-14 DIAGNOSIS — M7918 Myalgia, other site: Secondary | ICD-10-CM | POA: Diagnosis not present

## 2018-11-14 DIAGNOSIS — N946 Dysmenorrhea, unspecified: Secondary | ICD-10-CM | POA: Diagnosis not present

## 2018-12-03 DIAGNOSIS — K5900 Constipation, unspecified: Secondary | ICD-10-CM | POA: Diagnosis not present

## 2018-12-03 DIAGNOSIS — R140 Abdominal distension (gaseous): Secondary | ICD-10-CM | POA: Diagnosis not present

## 2018-12-03 DIAGNOSIS — R1030 Lower abdominal pain, unspecified: Secondary | ICD-10-CM | POA: Diagnosis not present

## 2018-12-03 DIAGNOSIS — R1013 Epigastric pain: Secondary | ICD-10-CM | POA: Diagnosis not present

## 2018-12-03 MED FILL — PEG-3350 SOLUTION: 420 | 1 days supply | Qty: 4000 | Fill #0

## 2018-12-08 DIAGNOSIS — K5900 Constipation, unspecified: Secondary | ICD-10-CM | POA: Diagnosis not present

## 2018-12-08 DIAGNOSIS — K229 Disease of esophagus, unspecified: Secondary | ICD-10-CM | POA: Diagnosis not present

## 2018-12-08 DIAGNOSIS — R1013 Epigastric pain: Secondary | ICD-10-CM | POA: Diagnosis not present

## 2018-12-08 DIAGNOSIS — K2930 Chronic superficial gastritis without bleeding: Secondary | ICD-10-CM | POA: Diagnosis not present

## 2018-12-08 DIAGNOSIS — K635 Polyp of colon: Secondary | ICD-10-CM | POA: Diagnosis not present

## 2018-12-08 DIAGNOSIS — K228 Other specified diseases of esophagus: Secondary | ICD-10-CM | POA: Diagnosis not present

## 2018-12-08 DIAGNOSIS — K2970 Gastritis, unspecified, without bleeding: Secondary | ICD-10-CM | POA: Diagnosis not present

## 2018-12-08 DIAGNOSIS — R1030 Lower abdominal pain, unspecified: Secondary | ICD-10-CM | POA: Diagnosis not present

## 2018-12-08 MED FILL — PANTOPRAZOLE SOD DR 40 MG T: 40 | 30 days supply | Qty: 30 | Fill #0

## 2018-12-22 ENCOUNTER — Other Ambulatory Visit: Payer: Self-pay

## 2018-12-22 ENCOUNTER — Ambulatory Visit: Payer: 59 | Attending: Obstetrics & Gynecology

## 2018-12-22 DIAGNOSIS — M533 Sacrococcygeal disorders, not elsewhere classified: Secondary | ICD-10-CM | POA: Diagnosis not present

## 2018-12-22 DIAGNOSIS — R293 Abnormal posture: Secondary | ICD-10-CM | POA: Insufficient documentation

## 2018-12-22 DIAGNOSIS — M62838 Other muscle spasm: Secondary | ICD-10-CM | POA: Diagnosis not present

## 2018-12-22 DIAGNOSIS — R109 Unspecified abdominal pain: Secondary | ICD-10-CM | POA: Diagnosis not present

## 2018-12-22 DIAGNOSIS — M7918 Myalgia, other site: Secondary | ICD-10-CM | POA: Insufficient documentation

## 2018-12-22 DIAGNOSIS — R140 Abdominal distension (gaseous): Secondary | ICD-10-CM | POA: Diagnosis not present

## 2018-12-22 DIAGNOSIS — R198 Other specified symptoms and signs involving the digestive system and abdomen: Secondary | ICD-10-CM | POA: Diagnosis not present

## 2018-12-22 MED FILL — DICYCLOMINE 10 MG CAPSULE: 10 | 30 days supply | Qty: 180 | Fill #0

## 2018-12-22 NOTE — Patient Instructions (Signed)
Stabilization: Diaphragmatic Breathing    Lie with knees bent, feet flat. Place one hand on stomach, other on chest. Breathe deeply through nose, lifting belly hand without any motion of hand on chest. Do this for at least 5 min. Per night and start to incorporate it into your day, especially when you have heightened stress.   Child's Pose Pelvic Floor Lengthening    Sit in knee-chest position and reach arms forward. Separate knees for comfort. Hold position for _5__ breaths. Repeat _2-3__ times. Do _1-2__ times per day.

## 2018-12-22 NOTE — Therapy (Signed)
[facility] MAIN [facility] SERVICES [address] [place], [place], [address] Phone: [phone]   Fax:  [phone]  Physical Therapy Evaluation  Patient Details  Name: Jessica Finley MRN: [id number] Date of Birth: 31-Aug-1996 Referring Provider (PT): Valere Dross   Encounter Date: 12/22/2018  PT End of Session - 12/22/18 1103    Visit Number  1    Number of Visits  12    Date for PT Re-Evaluation  03/16/19    Authorization - Visit Number  1    Authorization - Number of Visits  12    PT Start Time  0835    PT Stop Time  0935    PT Time Calculation (min)  60 min    Activity Tolerance  Patient tolerated treatment well;No increased pain    Behavior During Therapy  WFL for tasks assessed/performed       Past Medical History:  Diagnosis Date  . Recurrent tonsillitis 12/2017    Past Surgical History:  Procedure Laterality Date  . TONSILLECTOMY N/A 01/27/2018   Procedure: TONSILLECTOMY;  Surgeon: Serena Colonel, MD;  Location: [facility];  Service: ENT;  Laterality: N/A;  . WISDOM TOOTH EXTRACTION      There were no vitals filed for this visit.       [facility] PT Assessment - 12/22/18 0001      Assessment   Medical Diagnosis  Myalgia pelvic floor    Referring Provider (PT)  Valere Dross    Onset Date/Surgical Date  04/22/16    Next MD Visit  09/2019    Prior Therapy  Chiropractic care      Precautions   Precautions  None      Restrictions   Weight Bearing Restrictions  No      Balance Screen   Has the patient fallen in the past 6 months  No      Home Environment   Living Environment  Private residence    Living Arrangements  Spouse/significant other    Type of Home  House    Home Access  Stairs to enter    Entrance Stairs-Number of Steps  3    Entrance Stairs-Rails  None    Home Layout  One level    Home Equipment  None      Prior Function   Level of Independence  Independent      Cognition   Overall Cognitive Status  Within Functional Limits for tasks assessed        Pelvic Floor Physical Therapy Evaluation and Assessment  SCREENING  Falls in last 6 mo: no    Patient's communication preference:   Red Flags: Have you had any night sweats? no Unexplained weight loss? no Saddle anesthesia? no Unexplained changes in bowel or bladder habits? no  SUBJECTIVE  Patient reports: Pain in lower abdomen starting April 2017. Was really stressed at that point in time, pain fluctuates, is worse right before a BM and after eating, has pain with intercourse and prevents her from wanting to exercise.  Was told to wear a heel-lift in her shoe for leg-length difference by one chiropractor, told not to worry about it by another. Used to work out "a lot" in college.   Social/Family/Vocational History:   Works full time as a Therapist, art:  Had a colonoscopy/endoscopy which found inflammation in stomach/ small intestinal area.  L5 partial sacralization  Obstetrical History: none  Gynecological History: none  Urinary History: none  Gastrointestinal History: Has a BM ~ daily and sometimes multiple times a day, soft with mucus.  Sexual activity/pain: Pain with deep thrusting.  Location of pain: suprapubic. Low back (over last 6 months) Current pain:  5/10  Max pain:  7/10 Least pain:  0/10 Nature of pain: achy  Patient Goals: Not to have pain with intercourse or at rest and be able to exercise.     OBJECTIVE  Posture/Observations:  Sitting: slightly slouched, R leg crossed over L Standing: R ASIS low, R PSIS high  Palpation/Segmental Motion/Joint Play: Decreased mobility at R sacral border  Special tests:   Scoliosis: VERY slight R lumbar prominence with forward bend.  Supine-to-sit: R anterior rotation.  Leg-length: .5cm difference with R leg shorter   Range of Motion/Flexibilty:  Spine: R>L decreased sacral mobility.  Slight R deviation of C2 and C6, others deep/hidden. Tender through upper thoraci vertebrae. Hips:   Strength/MMT: Deferred to follow-up LE MMT  LE MMT Left Right  Hip flex:  (L2) /5 /5  Hip ext: /5 /5  Hip abd: /5 /5  Hip add: /5 /5  Hip IR /5 /5  Hip ER /5 /5     Abdominal:  Palpation: TTP at midline and LLQ and R Pectineus only. Diastasis: not assessed.  Pelvic Floor External Exam: Introitus Appears: Elevated Skin integrity: normal Palpation: TTP through B STP Cough: paradoxical Prolapse visible?: no Scar mobility: N/A  Internal Vaginal Exam: Strength (PERF): 3/5, 7 sec. 1 time  Symmetry: R>L for spasms Palpation: TTP through all regions with greatest tenderness at posterior PR/PC, and Coccygeus B. Prolapse: N/A   Internal Rectal Exam: Deferred Indefinatly Strength (PERF): Symmetry: Palpation: Prolapse:   Gait Analysis: not assessed.   Pelvic Floor Outcome Measures:  Female NIH-CPSI: 21/43 (49%) , PDI: 10/70 (14%)  INTERVENTIONS THIS SESSION: Self-care: Educated on the structure and function of the pelvic floor in relation to their symptoms as well as the POC, and initial HEP in order to set patient expectations and understanding from which we will build on in the future sessions. Therex: Educated on how to use diaphragmatic breathing and child's pose stretch to begin decreasing her pain and PFM tension as well as decrease neural sensitivity.  Total time: 60 min.          Objective measurements completed on examination: See above findings.              PT Education - 12/22/18 1103    Education Details  See Pt. Instructions and Interventions this session.    Person(s) Educated  Patient    Methods  Explanation    Comprehension  Verbalized understanding       PT Short Term Goals - 12/22/18 1119      PT SHORT TERM GOAL #1   Title  Patient will demonstrate improved pelvic alignment and balance of musculature surrounding the pelvis  to facilitate decreased PFM spasms and decrease pelvic pain.    Baseline  R anterior rotation of innominate, R L5 partial sacrilization    Time  6    Period  Weeks    Status  New    Target Date  02/02/19      PT SHORT TERM GOAL #2   Title  Patient will demonstrate HEP x1 in the clinic to demonstrate understanding and proper form to allow for further improvement.    Baseline  Pt. lacks understanding of what therapeutic exercises will help reduce her pain.    Time  6  Period  Weeks    Status  New    Target Date  02/02/19      PT SHORT TERM GOAL #3   Title  Patient will report a reduction in pain to no greater than 4/10 over the prior week to demonstrate symptom improvement.    Baseline  max pain of 7/10    Time  6    Period  Weeks    Status  New    Target Date  02/02/19        PT Long Term Goals - 12/22/18 1123      PT LONG TERM GOAL #1   Title  Patient will report no pain with intercourse to demonstrate improved functional ability.    Baseline  Pain with deep thrusting    Time  12    Period  Weeks    Status  New    Target Date  03/16/19      PT LONG TERM GOAL #2   Title  Patient will score less than or equal to 20% on the Female NIH-CPSI  and 7% on the [facility] to demonstrate a reduction in pain, urinary symptoms, and an improved quality of life.    Baseline  Female NIH-CPSI: 21/43 (49%) , PDI: 10/70 (14%)    Time  12    Period  Weeks    Status  New    Target Date  03/16/19      PT LONG TERM GOAL #3   Title  Patient will describe pain no greater than 2/10 before BM, after eating, and with increased stress to demonstrate improved functional ability.    Baseline  7/10 max pain    Time  12    Period  Weeks    Status  New    Target Date  03/16/19             Plan - 12/22/18 1104    Clinical Impression Statement  Pt. is a 22 y/o female who presents today with cheif c/o pelvic pain at rest and with intercourse as well as LBP. Her PMH is significant for a R L5  partial sacrilization and leg-length discrepancy. Clinical exam reveals R anterior rotation of innominate, leg-length discrepancy, and spasms acting upon and witin the PFM on the R>L as well as  decreased sacral mobility and stability on R. She will benefit from skilled Pelvic PT to address the noted defecits and to continue to assess for and addresss other potential causes of pelvic pain.     History and Personal Factors relevant to plan of care:  R L5 partial sacrilization and leg-length discrepancy.    Clinical Presentation  Stable    Clinical Decision Making  Low    Rehab Potential  Excellent    PT Frequency  1x / week    PT Duration  12 weeks    PT Treatment/Interventions  ADLs/Self Care Home Management;Moist Heat;Functional mobility training;Therapeutic activities;Patient/family education;Therapeutic exercise;Neuromuscular re-education;Balance training;Manual techniques;Orthotic Fit/Training;Passive range of motion;Dry needling;Taping;Spinal Manipulations;Joint Manipulations    PT Next Visit Plan  work on R anterior/L posterior pelvic obliquity (R L5 sacralization) TP release to R adductors, assess "bow-legged" and  re-measure leg-length, give heel-lift PRN     PT Home Exercise Plan  Diaphragmatic breathing and child's pose.    Recommended Other Services  stress-reduction counseling/medetation    Consulted and Agree with Plan of Care  Patient       Patient will benefit from skilled therapeutic intervention in order to improve the  following deficits and impairments:  Pain, Decreased mobility, Increased muscle spasms, Impaired tone, Postural dysfunction, Decreased activity tolerance, Decreased endurance, Decreased range of motion, Decreased strength, Decreased balance  Visit Diagnosis: Other muscle spasm  Sacrococcygeal disorders, not elsewhere classified  Abnormal posture  Myalgia of pelvic floor     Problem List Patient Active Problem List   Diagnosis Date Noted  . Low serum  cortisol level (HCC) 09/09/2017  . Serum estradiol <50 pg/ml 09/09/2017   Cleophus MoltKeeli T. Gailes DPT, ATC Cleophus MoltKeeli T Gailes 12/22/2018, 11:32 AM  [facility] MAIN [facility] [address] [place], [place], [address] Phone: [phone]   Fax:  [phone]  Name: Tyron RussellSara Finley MRN: [id number] Date of Birth: 01/21/1996

## 2018-12-30 ENCOUNTER — Ambulatory Visit: Payer: 59

## 2018-12-30 DIAGNOSIS — R293 Abnormal posture: Secondary | ICD-10-CM

## 2018-12-30 DIAGNOSIS — M533 Sacrococcygeal disorders, not elsewhere classified: Secondary | ICD-10-CM | POA: Diagnosis not present

## 2018-12-30 DIAGNOSIS — M62838 Other muscle spasm: Secondary | ICD-10-CM

## 2018-12-30 DIAGNOSIS — M7918 Myalgia, other site: Secondary | ICD-10-CM

## 2018-12-30 DIAGNOSIS — Z760 Encounter for issue of repeat prescription: Secondary | ICD-10-CM | POA: Diagnosis not present

## 2018-12-30 NOTE — Patient Instructions (Signed)
Flexors, Lunge  Hip Flexor Stretch: Proposal Pose    Maintain pelvic tuck under, lift pubic bone toward navel. Engage posterior hip muscles (firm glute muscles of leg in back position) and shift forward until you feel stretch on front of leg that is down. To increase stretch, maintain balance and ease hips forward. You may use one hand on a chair for balance if needed. Hold for __5__ breaths. Repeat __2-3__ times each leg.  Do _1-2__ times per day.   Pelvic Rotation: Contract / Relax (Supine)    With knees bent over bolster, right knee crossed over other, press thighs together tightly without allowing movement. Hold __5__ seconds. Relax. Repeat __5__ times per set. Do __1-2__ sets per session. Do __1__ sessions per day.   Pelvic Rotation: Contract / Relax (Supine)  MET to Correct Right Anteriorly Rotated/Left Posteriorly Rotated Innominate   Begin laying on your back with your feet at 90 degrees. Put a dowel/broomstick  through your legs, behind your right knee and in front of your left knee. Stabilize the dowel on ether side with your hands.  Press down with the right leg and up with the left leg. Hold for 5 seconds  then slowly relax. Repeat 5 times.

## 2018-12-30 NOTE — Therapy (Addendum)
[place] MAIN [facility] SERVICES [address] [place], [place], [address] Phone: [phone]   Fax:  [phone]  Physical Therapy Treatment  Patient Details  Name: Jessica Finley MRN: [id number] Date of Birth: 08-30-1996 Referring Provider (PT): Marolyn Hammock   Encounter Date: 12/30/2018  PT End of Session - 01/05/19 0817    Visit Number  2    Number of Visits  12    Date for PT Re-Evaluation  03/16/19    Authorization - Visit Number  2    Authorization - Number of Visits  12    PT Start Time  1300    PT Stop Time  1400    PT Time Calculation (min)  60 min    Activity Tolerance  Patient tolerated treatment well;No increased pain    Behavior During Therapy  WFL for tasks assessed/performed       Past Medical History:  Diagnosis Date  . Recurrent tonsillitis 12/2017    Past Surgical History:  Procedure Laterality Date  . TONSILLECTOMY N/A 01/27/2018   Procedure: TONSILLECTOMY;  Surgeon: Izora Gala, MD;  Location: [place];  Service: ENT;  Laterality: N/A;  . WISDOM TOOTH EXTRACTION      There were no vitals filed for this visit.      Pelvic Floor Physical Therapy Treatment Note  SCREENING  Changes in medications, allergies, or medical history?: no   SUBJECTIVE  Patient reports: Her chiropractor told her that her "PSIS was posterior". She has been really trying to work on her diaphragmatic breaths, can tell that increased stress makes her pain worse.  Precautions:  L partial L5 sacralization  Pain update: No change  Patient Goals: Not to have pain with intercourse or at rest and be able to exercise.     OBJECTIVE  Changes in: Posture/Observations:  R anterior rotation, improved by ~ 50% following treatment.  Range of Motion/Flexibilty:  Decreased hip extension on R>L  Strength/MMT:  LE MMT:  Pelvic floor:  Abdominal:   Palpation:  Gait Analysis:  INTERVENTIONS THIS  SESSION: Manual: Performed STM and TP release to R Iliacus and PA mobs to R sacral border followed by MET correction for R anterior rotation to decrease spasm and allow for improved success with MET correction to achieve alignment and decreased pressure on nerve roots. Dry-needle: Performed TPDN with .30x82m needle to R iliacus to decrease spasm and allow for improved success with MET correction to achieve alignment and decreased pressure on nerve roots. Therex: educated on and practiced hip-flexor stretch and MET correction for R anterior rotation of innominate. Reviewed squat mechanics for appropriate depth. To continue to improve alignment and allow for proper recruitment of deep-core with exercise to maintain improved alignment.  Total time: 60 min.                        PT Education - 01/05/19 0817    Education Details  See Pt. Instructions and Interventions this session    Person(s) Educated  Patient    Methods  Explanation;Demonstration;Verbal cues;Handout    Comprehension  Verbalized understanding;Returned demonstration;Verbal cues required       PT Short Term Goals - 12/22/18 1119      PT SHORT TERM GOAL #1   Title  Patient will demonstrate improved pelvic alignment and balance of musculature surrounding the pelvis to facilitate decreased PFM spasms and decrease pelvic pain.    Baseline  R anterior rotation of innominate,  R L5 partial sacrilization    Time  6    Period  Weeks    Status  New    Target Date  02/02/19      PT SHORT TERM GOAL #2   Title  Patient will demonstrate HEP x1 in the clinic to demonstrate understanding and proper form to allow for further improvement.    Baseline  Pt. lacks understanding of what therapeutic exercises will help reduce her pain.    Time  6    Period  Weeks    Status  New    Target Date  02/02/19      PT SHORT TERM GOAL #3   Title  Patient will report a reduction in pain to no greater than 4/10 over the prior week  to demonstrate symptom improvement.    Baseline  max pain of 7/10    Time  6    Period  Weeks    Status  New    Target Date  02/02/19        PT Long Term Goals - 12/22/18 1123      PT LONG TERM GOAL #1   Title  Patient will report no pain with intercourse to demonstrate improved functional ability.    Baseline  Pain with deep thrusting    Time  12    Period  Weeks    Status  New    Target Date  03/16/19      PT LONG TERM GOAL #2   Title  Patient will score less than or equal to 20% on the Female NIH-CPSI  and 7% on the [facility] to demonstrate a reduction in pain, urinary symptoms, and an improved quality of life.    Baseline  Female NIH-CPSI: 21/43 (49%) , PDI: 10/70 (14%)    Time  12    Period  Weeks    Status  New    Target Date  03/16/19      PT LONG TERM GOAL #3   Title  Patient will describe pain no greater than 2/10 before BM, after eating, and with increased stress to demonstrate improved functional ability.    Baseline  7/10 max pain    Time  12    Period  Weeks    Status  New    Target Date  03/16/19            Plan - 01/05/19 0818    Clinical Impression Statement  Pt. responded well to all interventions today, demonstrating decreased spasm and ~ 50% improvement in pelvic alignment and full understanding of all education with correct performance of new HEP exercises. Cotinue per POC.    History and Personal Factors relevant to plan of care:  L L5 partial Sacralization    Clinical Presentation  Stable    Clinical Decision Making  Low    Rehab Potential  Excellent    PT Frequency  1x / week    PT Duration  12 weeks    PT Treatment/Interventions  ADLs/Self Care Home Management;Moist Heat;Functional mobility training;Therapeutic activities;Patient/family education;Therapeutic exercise;Neuromuscular re-education;Balance training;Manual techniques;Orthotic Fit/Training;Passive range of motion;Dry needling;Taping;Spinal Manipulations;Joint Manipulations    PT Next  Visit Plan  work on R anterior/L posterior pelvic obliquity (L L5 sacralization) TP release to R adductors, assess "bow-legged" and  re-measure leg-length, give heel-lift PRN     PT Home Exercise Plan  Diaphragmatic breathing and child's pose, hip-flexor stretch, MET correction for R innominate.    Recommended Other Services  stress-reduction counseling/meditation  Consulted and Agree with Plan of Care  Patient       Patient will benefit from skilled therapeutic intervention in order to improve the following deficits and impairments:  Pain, Decreased mobility, Increased muscle spasms, Impaired tone, Postural dysfunction, Decreased activity tolerance, Decreased endurance, Decreased range of motion, Decreased strength, Decreased balance  Visit Diagnosis: Other muscle spasm  Sacrococcygeal disorders, not elsewhere classified  Abnormal posture  Myalgia of pelvic floor     Problem List Patient Active Problem List   Diagnosis Date Noted  . Low serum cortisol level ([place]) 09/09/2017  . Serum estradiol <50 pg/ml 09/09/2017   Willa Rough DPT, ATC Willa Rough 01/05/2019, 8:28 AM  [place] MAIN [facility] SERVICES [address] [place], [place], [address] Phone: [phone]   Fax:  [phone]  Name: Mahrosh Donnell MRN: [id number] Date of Birth: 09/25/1996

## 2019-01-08 ENCOUNTER — Ambulatory Visit: Payer: 59 | Attending: Obstetrics & Gynecology

## 2019-01-08 DIAGNOSIS — M533 Sacrococcygeal disorders, not elsewhere classified: Secondary | ICD-10-CM | POA: Diagnosis not present

## 2019-01-08 DIAGNOSIS — M7918 Myalgia, other site: Secondary | ICD-10-CM | POA: Diagnosis not present

## 2019-01-08 DIAGNOSIS — M62838 Other muscle spasm: Secondary | ICD-10-CM | POA: Insufficient documentation

## 2019-01-08 DIAGNOSIS — R293 Abnormal posture: Secondary | ICD-10-CM | POA: Insufficient documentation

## 2019-01-08 NOTE — Therapy (Signed)
[place] MAIN [facility] SERVICES [address] [place], [place], [address] Phone: [phone]   Fax:  [phone]  Physical Therapy Treatment  Patient Details  Name: Jessica Finley MRN: [id number] Date of Birth: Jun 09, 1996 Referring Provider (PT): Marolyn Hammock   Encounter Date: 01/08/2019  PT End of Session - 01/14/19 0957    Visit Number  3    Number of Visits  12    Date for PT Re-Evaluation  03/16/19    Authorization - Visit Number  3    Authorization - Number of Visits  12    PT Start Time  0805    PT Stop Time  0905    PT Time Calculation (min)  60 min    Activity Tolerance  Patient tolerated treatment well;No increased pain    Behavior During Therapy  WFL for tasks assessed/performed       Past Medical History:  Diagnosis Date  . Recurrent tonsillitis 12/2017    Past Surgical History:  Procedure Laterality Date  . TONSILLECTOMY N/A 01/27/2018   Procedure: TONSILLECTOMY;  Surgeon: Izora Gala, MD;  Location: [place];  Service: ENT;  Laterality: N/A;  . WISDOM TOOTH EXTRACTION      There were no vitals filed for this visit.    Pelvic Floor Physical Therapy Treatment Note  SCREENING  Changes in medications, allergies, or medical history?: no    SUBJECTIVE  Patient reports: Felt really good for ~2 days and then started going back toward her normal, her L hip has been giving her some more pain in the last few days.  Pain update:  Location of pain: L hip Current pain:  2/10  Max pain:  5/10 Least pain:  0/10 Nature of pain: achy  Patient Goals: Not to have pain with intercourse or at rest and be able to exercise.   OBJECTIVE  Changes in: Posture/Observations:  RLE is 1/2 cm shorter than LLE and R anterior rotation of innominate present.  Abdominal:  Pt. Has difficulty isolating deep-core vs superficial but able to with min/mod cueing.  Palpation: TTP through L>R  hip-flexors  INTERVENTIONS THIS SESSION: Manual: Performed TP release and STM to L Iliacus and adductors to decrease pain and spasm and decrease pressure on nerves innervating the pelvic floor. Dry-needle: Performed TP release to L Iliacus to decrease pain and spasm and allow for decreased pull into mal-alignment and poor posture for decreased pain. Therex: Educated on and practiced chin-tucks, scapular retraction/downward rotation, and mini-marches to improve deep-core stability and help prevent return of mal-alignment and spasm/pain.  Total time: 60 min.                         PT Education - 01/14/19 0956    Education Details  See Pt. Instructions and Interventions this session.    Person(s) Educated  Patient    Methods  Explanation;Demonstration;Tactile cues;Verbal cues    Comprehension  Verbalized understanding;Returned demonstration;Verbal cues required;Tactile cues required;Need further instruction       PT Short Term Goals - 12/22/18 1119      PT SHORT TERM GOAL #1   Title  Patient will demonstrate improved pelvic alignment and balance of musculature surrounding the pelvis to facilitate decreased PFM spasms and decrease pelvic pain.    Baseline  R anterior rotation of innominate, R L5 partial sacrilization    Time  6    Period  Weeks    Status  New  Target Date  02/02/19      PT SHORT TERM GOAL #2   Title  Patient will demonstrate HEP x1 in the clinic to demonstrate understanding and proper form to allow for further improvement.    Baseline  Pt. lacks understanding of what therapeutic exercises will help reduce her pain.    Time  6    Period  Weeks    Status  New    Target Date  02/02/19      PT SHORT TERM GOAL #3   Title  Patient will report a reduction in pain to no greater than 4/10 over the prior week to demonstrate symptom improvement.    Baseline  max pain of 7/10    Time  6    Period  Weeks    Status  New    Target Date  02/02/19         PT Long Term Goals - 12/22/18 1123      PT LONG TERM GOAL #1   Title  Patient will report no pain with intercourse to demonstrate improved functional ability.    Baseline  Pain with deep thrusting    Time  12    Period  Weeks    Status  New    Target Date  03/16/19      PT LONG TERM GOAL #2   Title  Patient will score less than or equal to 20% on the Female NIH-CPSI  and 7% on the [facility] to demonstrate a reduction in pain, urinary symptoms, and an improved quality of life.    Baseline  Female NIH-CPSI: 21/43 (49%) , PDI: 10/70 (14%)    Time  12    Period  Weeks    Status  New    Target Date  03/16/19      PT LONG TERM GOAL #3   Title  Patient will describe pain no greater than 2/10 before BM, after eating, and with increased stress to demonstrate improved functional ability.    Baseline  7/10 max pain    Time  12    Period  Weeks    Status  New    Target Date  03/16/19            Plan - 01/14/19 0959    Clinical Impression Statement  Pt. responded well to all interventions today, demonstrating decreased spasm in the L hip and adductors as well as understanding of all education provided. Continue per POC.     History and Personal Factors relevant to plan of care:  L L5 partial sacralization and LLE 1/2 cm long    Clinical Presentation  Stable    Clinical Decision Making  Low    Rehab Potential  Excellent    PT Frequency  1x / week    PT Duration  12 weeks    PT Treatment/Interventions  ADLs/Self Care Home Management;Moist Heat;Functional mobility training;Therapeutic activities;Patient/family education;Therapeutic exercise;Neuromuscular re-education;Balance training;Manual techniques;Orthotic Fit/Training;Passive range of motion;Dry needling;Taping;Spinal Manipulations;Joint Manipulations    PT Next Visit Plan  work on R anterior/L posterior pelvic obliquity (L L5 sacralization) TP release to R adductors hold-relax lengthening of R hip-flexors, further MET correction,  assess "bow-legged" and  re-measure leg-length after pelvis aligned,    PT Home Exercise Plan  Diaphragmatic breathing and child's pose, hip-flexor stretch, MET correction for R innominate, chin-tucks, scapular retractions and mini-marches.    Consulted and Agree with Plan of Care  Patient       Patient will benefit from  skilled therapeutic intervention in order to improve the following deficits and impairments:  Pain, Decreased mobility, Increased muscle spasms, Impaired tone, Postural dysfunction, Decreased activity tolerance, Decreased endurance, Decreased range of motion, Decreased strength, Decreased balance  Visit Diagnosis: Other muscle spasm  Sacrococcygeal disorders, not elsewhere classified  Abnormal posture  Myalgia of pelvic floor     Problem List Patient Active Problem List   Diagnosis Date Noted  . Low serum cortisol level ([place]) 09/09/2017  . Serum estradiol <50 pg/ml 09/09/2017   Willa Rough DPT, ATC Willa Rough 01/15/2019, 11:54 AM  [place] MAIN [facility] SERVICES [address] [place], [place], [address] Phone: [phone]   Fax:  [phone]  Name: Jessica Finley MRN: [id number] Date of Birth: 08/19/1996

## 2019-01-08 NOTE — Patient Instructions (Signed)
  Shoulder Retraction and Downward Rotation   Rotate the shoulder blades back and down as if you had to hold a pencil between them, holding for 1 full second each time. Repeat this _3x10_ times _1_ times per day.      Start with Shoulder Retraction and Downward Rotation pictures above, holding the position while pulling the chin straight back as if trying to make a "double chin".  Breathe in forward and breathe out as you pull back, repeating this _3x10__ times _1___ times per day.  Mini-Marches    Exhale, drawing the lower tummy (TA) in toward the back bone and hold contraction while you lift one foot ~ 2 inches off the mat, then the other foot before relaxing and resetting. Try to keep your hips from rocking, using your hands to sense whether they are staying even as pictured.      Perform _3x10__ repetitions for _1-2__ sets. Do this _1_ times per day.

## 2019-01-14 ENCOUNTER — Ambulatory Visit: Payer: 59

## 2019-01-14 DIAGNOSIS — R293 Abnormal posture: Secondary | ICD-10-CM

## 2019-01-14 DIAGNOSIS — M7918 Myalgia, other site: Secondary | ICD-10-CM | POA: Diagnosis not present

## 2019-01-14 DIAGNOSIS — M62838 Other muscle spasm: Secondary | ICD-10-CM

## 2019-01-14 DIAGNOSIS — M533 Sacrococcygeal disorders, not elsewhere classified: Secondary | ICD-10-CM | POA: Diagnosis not present

## 2019-01-14 NOTE — Patient Instructions (Signed)
  *  bring top leg forward and straighten bottom leg for deeper twist. Exhale as you rotate. R side-down only.    Hold for 30 seconds (5 deep breaths) and repeat 2-3 times with R arm over once a day.   Hold for a total of 1:30 with the R-side-down each day. Split into smaller chunks as needed to maintain good form. Cathie Hoops can perform planks on both sides with exercise routine but make sure you do 1:30 more on the right.     Hold and take deep breaths until you feel the spasm release, pain should decrease by at least 50%.

## 2019-01-15 NOTE — Therapy (Signed)
[facility] MAIN REHAB SERVICES [address] [place], [place], [address] Phone: [phone]   Fax:  [phone]  Physical Therapy Treatment  Patient Details  Name: Jessica Finley MRN: [id number] Date of Birth: 10/30/1996 Referring Provider (PT): Erica Fletcher Robinson   Encounter Date: 01/14/2019  PT End of Session - 01/16/19 1427    Visit Number  4    Number of Visits  12    Date for PT Re-Evaluation  03/16/19    Authorization - Visit Number  4    Authorization - Number of Visits  12    PT Start Time  1730    PT Stop Time  1830    PT Time Calculation (min)  60 min    Activity Tolerance  Patient tolerated treatment well;No increased pain    Behavior During Therapy  WFL for tasks assessed/performed       Past Medical History:  Diagnosis Date  . Recurrent tonsillitis 12/2017    Past Surgical History:  Procedure Laterality Date  . TONSILLECTOMY N/A 01/27/2018   Procedure: TONSILLECTOMY;  Surgeon: Rosen, Jefry, MD;  Location: [facility];  Service: ENT;  Laterality: N/A;  . WISDOM TOOTH EXTRACTION      There were no vitals filed for this visit.    Pelvic Floor Physical Therapy Treatment Note  SCREENING  Changes in medications, allergies, or medical history?: no    SUBJECTIVE  Patient reports: She has continued to have hip pain but has had less pain with intercourse.  Precautions:  Leg-length discrepancy and L partial sacralization of L5  Pain update:  Location of pain: L hip Current pain: 2/10  Max pain: 5/10 Least pain: 0/10 Nature of pain:achy  *Pain described as less sharp following treatment, and less intense.  Patient Goals: Not to have pain with intercourse or at rest and be able to exercise.   OBJECTIVE  Changes in: Posture/Observations:  R anterior rotation, L PSIS, ASIS, and iliac crest slightly high in standing.   *level with addition of heel-lift.  Palpation: TTP to  Paraspinals and multifidus on L at T-L junction and below and at R L4-S1 multifidus  INTERVENTIONS THIS SESSION: Dry-needle: Performed TPDN with a .30x60mm needle to Paraspinals and multifidus on L at T-L junction and below and at R L4-S1 multifidus for decreased spasm and pain.  Manual: re-measured leg-length and re-assessed pelvic alignment. Performed STM and TP release to Paraspinals and multifidus on L at T-L junction and below and at R L4-S1 multifidus for decreased spasm and pain. Educated on how to perform self TP release with tennis ball to QL and paraspinals for continued spasm reduction and decreased pain. Therex: educated on and practiced bow-and arrow, side-stretch, and side-plank to decrease scoliosis and allow for improved alignment of spine for decreased pressure on nerve roots and pain.   Total time: 60 min.                         PT Education - 01/16/19 1427    Education Details  See Pt. Instructions and Interventions this session.    Person(s) Educated  Patient    Methods  Explanation;Demonstration;Verbal cues;Handout    Comprehension  Verbalized understanding;Returned demonstration;Verbal cues required       PT Short Term Goals - 12/22/18 1119      PT SHORT TERM GOAL #1   Title  Patient will demonstrate improved pelvic alignment and balance of musculature surrounding the pelvis   to facilitate decreased PFM spasms and decrease pelvic pain.    Baseline  R anterior rotation of innominate, R L5 partial sacrilization    Time  6    Period  Weeks    Status  New    Target Date  02/02/19      PT SHORT TERM GOAL #2   Title  Patient will demonstrate HEP x1 in the clinic to demonstrate understanding and proper form to allow for further improvement.    Baseline  Pt. lacks understanding of what therapeutic exercises will help reduce her pain.    Time  6    Period  Weeks    Status  New    Target Date  02/02/19      PT SHORT TERM GOAL #3   Title  Patient  will report a reduction in pain to no greater than 4/10 over the prior week to demonstrate symptom improvement.    Baseline  max pain of 7/10    Time  6    Period  Weeks    Status  New    Target Date  02/02/19        PT Long Term Goals - 12/22/18 1123      PT LONG TERM GOAL #1   Title  Patient will report no pain with intercourse to demonstrate improved functional ability.    Baseline  Pain with deep thrusting    Time  12    Period  Weeks    Status  New    Target Date  03/16/19      PT LONG TERM GOAL #2   Title  Patient will score less than or equal to 20% on the Female NIH-CPSI  and 7% on the [facility] to demonstrate a reduction in pain, urinary symptoms, and an improved quality of life.    Baseline  Female NIH-CPSI: 21/43 (49%) , PDI: 10/70 (14%)    Time  12    Period  Weeks    Status  New    Target Date  03/16/19      PT LONG TERM GOAL #3   Title  Patient will describe pain no greater than 2/10 before BM, after eating, and with increased stress to demonstrate improved functional ability.    Baseline  7/10 max pain    Time  12    Period  Weeks    Status  New    Target Date  03/16/19            Plan - 01/16/19 1428    Clinical Impression Statement  Pt. responded well to all interventions today, demonstrating decreased spasm and less pain as well as understanding and correct performance of all education and exercises given. Continue per POC.    Clinical Presentation  Stable    Clinical Decision Making  Low    Rehab Potential  Excellent    PT Frequency  1x / week    PT Duration  12 weeks    PT Treatment/Interventions  ADLs/Self Care Home Management;Moist Heat;Functional mobility training;Therapeutic activities;Patient/family education;Therapeutic exercise;Neuromuscular re-education;Balance training;Manual techniques;Orthotic Fit/Training;Passive range of motion;Dry needling;Taping;Spinal Manipulations;Joint Manipulations    PT Next Visit Plan  work on R anterior/L  posterior pelvic obliquity (L L5 sacralization) TP release to R adductors hold-relax lengthening of R hip-flexors, further MET correction, assess "bow-legged" and  re-measure leg-length after pelvis aligned,    PT Home Exercise Plan  Diaphragmatic breathing and child's pose, hip-flexor stretch, MET correction for R innominate, chin-tucks, scapular retractions and  mini-marches, bow-and-arrow, side-stretch, side-plank.     Consulted and Agree with Plan of Care  Patient       Patient will benefit from skilled therapeutic intervention in order to improve the following deficits and impairments:  Pain, Decreased mobility, Increased muscle spasms, Impaired tone, Postural dysfunction, Decreased activity tolerance, Decreased endurance, Decreased range of motion, Decreased strength, Decreased balance  Visit Diagnosis: Other muscle spasm  Sacrococcygeal disorders, not elsewhere classified  Abnormal posture  Myalgia of pelvic floor     Problem List Patient Active Problem List   Diagnosis Date Noted  . Low serum cortisol level ([place]) 09/09/2017  . Serum estradiol <50 pg/ml 09/09/2017   Willa Rough DPT, ATC Willa Rough 01/16/2019, 2:31 PM  [place] MAIN [facility] SERVICES [address] [place], [place], [address] Phone: [phone]   Fax:  [phone]  Name: Kyeisha Janowicz MRN: [id number] Date of Birth: [birth year]

## 2019-01-19 ENCOUNTER — Ambulatory Visit: Payer: 59

## 2019-01-19 DIAGNOSIS — R293 Abnormal posture: Secondary | ICD-10-CM

## 2019-01-19 DIAGNOSIS — M7918 Myalgia, other site: Secondary | ICD-10-CM | POA: Diagnosis not present

## 2019-01-19 DIAGNOSIS — M533 Sacrococcygeal disorders, not elsewhere classified: Secondary | ICD-10-CM

## 2019-01-19 DIAGNOSIS — M62838 Other muscle spasm: Secondary | ICD-10-CM | POA: Diagnosis not present

## 2019-01-19 NOTE — Therapy (Signed)
[place] MAIN [facility] SERVICES [address] [place], [place], [address] Phone: [phone]   Fax:  [phone]  Physical Therapy Treatment  Patient Details  Name: Denia Mcvicar MRN: [id number] Date of Birth: 1996-06-13 Referring Provider (PT): Marolyn Hammock   Encounter Date: 01/19/2019  PT End of Session - 01/20/19 1901    Visit Number  5    Number of Visits  12    Date for PT Re-Evaluation  03/16/19    Authorization - Visit Number  5    Authorization - Number of Visits  12    PT Start Time  0830    PT Stop Time  0930    PT Time Calculation (min)  60 min    Activity Tolerance  Patient tolerated treatment well;No increased pain    Behavior During Therapy  WFL for tasks assessed/performed       Past Medical History:  Diagnosis Date  . Recurrent tonsillitis 12/2017    Past Surgical History:  Procedure Laterality Date  . TONSILLECTOMY N/A 01/27/2018   Procedure: TONSILLECTOMY;  Surgeon: Izora Gala, MD;  Location: [place];  Service: ENT;  Laterality: N/A;  . WISDOM TOOTH EXTRACTION      There were no vitals filed for this visit.    Pelvic Floor Physical Therapy Treatment Note  SCREENING  Changes in medications, allergies, or medical history?: no    SUBJECTIVE  Patient reports: Pain has not changed, has been using lacrosse ball on L anterior hip daily, has not tried it on the back. Pain seems to be random, not always triggered by similar pattern.  Precautions:  Leg-length discrepancy and L partial sacralization of L5  Pain update:  Location of pain: L hip. Current pain:  5/10  Max pain:  5/10 Least pain:  0/10 Nature of pain: achy  Patient Goals: Not to have pain with intercourse or at rest and be able to exercise.   OBJECTIVE  Changes in: Posture/Observations:  Locks knees, anterior pelvic tilt.  Palpation: TTP through B Psoas, L>R and L lumbar paraspinals and Multifidus. Gait  Analysis: Over-pronates B, L>R. Mild trendelenburg and narrow BOS, anterior pelvic tilt.  INTERVENTIONS THIS SESSION: Manual: Performed hold-relax lengthening to B hip-flexors and TP release to L Psoas and L4/L5 lumbar paraspinals and Multifidus to decrease spasm and pain and allow for decreased pressure on nerve roots to allow for decreased pain. Therex: educated on and performed monster walks with green band to improve recruitment and strength of glute med to decrease medial collapse and pronation as well as improve overall posture to maintain improved balance of musculature surrounding the pelvis and decrease pressure on lumbosacral nerve roots. Self-care: educated on the use of arch support to help decrease medial collapse and improve posture/alignment for long-term decrease in pain.   Total time: 60 min.                          PT Education - 01/20/19 1858    Education Details  See Interventions this session    Person(s) Educated  Patient    Methods  Explanation;Demonstration;Verbal cues    Comprehension  Verbalized understanding;Returned demonstration;Verbal cues required       PT Short Term Goals - 12/22/18 1119      PT SHORT TERM GOAL #1   Title  Patient will demonstrate improved pelvic alignment and balance of musculature surrounding the pelvis to facilitate decreased PFM spasms and decrease  pelvic pain.    Baseline  R anterior rotation of innominate, R L5 partial sacrilization    Time  6    Period  Weeks    Status  New    Target Date  02/02/19      PT SHORT TERM GOAL #2   Title  Patient will demonstrate HEP x1 in the clinic to demonstrate understanding and proper form to allow for further improvement.    Baseline  Pt. lacks understanding of what therapeutic exercises will help reduce her pain.    Time  6    Period  Weeks    Status  New    Target Date  02/02/19      PT SHORT TERM GOAL #3   Title  Patient will report a reduction in pain to no  greater than 4/10 over the prior week to demonstrate symptom improvement.    Baseline  max pain of 7/10    Time  6    Period  Weeks    Status  New    Target Date  02/02/19        PT Long Term Goals - 12/22/18 1123      PT LONG TERM GOAL #1   Title  Patient will report no pain with intercourse to demonstrate improved functional ability.    Baseline  Pain with deep thrusting    Time  12    Period  Weeks    Status  New    Target Date  03/16/19      PT LONG TERM GOAL #2   Title  Patient will score less than or equal to 20% on the Female NIH-CPSI  and 7% on the [facility] to demonstrate a reduction in pain, urinary symptoms, and an improved quality of life.    Baseline  Female NIH-CPSI: 21/43 (49%) , PDI: 10/70 (14%)    Time  12    Period  Weeks    Status  New    Target Date  03/16/19      PT LONG TERM GOAL #3   Title  Patient will describe pain no greater than 2/10 before BM, after eating, and with increased stress to demonstrate improved functional ability.    Baseline  7/10 max pain    Time  12    Period  Weeks    Status  New    Target Date  03/16/19            Plan - 01/20/19 1902    Clinical Impression Statement  Pt. responded well to all interventions today, demonstrating reduction of spasm and improved pelvic alignment as well as understanding of all education provided and appropriate performance of exercises given. Continue per POC.    Clinical Presentation  Stable    Clinical Decision Making  Low    Rehab Potential  Excellent    PT Frequency  1x / week    PT Duration  12 weeks    PT Treatment/Interventions  ADLs/Self Care Home Management;Moist Heat;Functional mobility training;Therapeutic activities;Patient/family education;Therapeutic exercise;Neuromuscular re-education;Balance training;Manual techniques;Orthotic Fit/Training;Passive range of motion;Dry needling;Taping;Spinal Manipulations;Joint Manipulations    PT Next Visit Plan  re-assess alignment, consider DN to  low lumbar and then internal TP release.      PT Home Exercise Plan  Diaphragmatic breathing and child's pose, hip-flexor stretch, MET correction for R innominate, chin-tucks, scapular retractions and mini-marches, bow-and-arrow, side-stretch, side-plank.     Recommended Other Services  stress reduction counseling/meditation    Consulted and Agree with  Plan of Care  Patient       Patient will benefit from skilled therapeutic intervention in order to improve the following deficits and impairments:  Pain, Decreased mobility, Increased muscle spasms, Impaired tone, Postural dysfunction, Decreased activity tolerance, Decreased endurance, Decreased range of motion, Decreased strength, Decreased balance  Visit Diagnosis: Other muscle spasm  Sacrococcygeal disorders, not elsewhere classified  Abnormal posture  Myalgia of pelvic floor     Problem List Patient Active Problem List   Diagnosis Date Noted  . Low serum cortisol level ([place]) 09/09/2017  . Serum estradiol <50 pg/ml 09/09/2017   Willa Rough DPT, ATC Willa Rough 01/20/2019, 7:21 PM  [place] MAIN [facility] SERVICES [address] [place], [place], [address] Phone: [phone]   Fax:  [phone]  Name: Aziya Arena MRN: [id number] Date of Birth: June 21, 1996

## 2019-01-20 NOTE — Patient Instructions (Signed)
Given handout with monster walks and info on arch support.

## 2019-01-29 ENCOUNTER — Ambulatory Visit: Payer: 59

## 2019-01-29 DIAGNOSIS — R293 Abnormal posture: Secondary | ICD-10-CM | POA: Diagnosis not present

## 2019-01-29 DIAGNOSIS — M62838 Other muscle spasm: Secondary | ICD-10-CM | POA: Diagnosis not present

## 2019-01-29 DIAGNOSIS — M7918 Myalgia, other site: Secondary | ICD-10-CM | POA: Diagnosis not present

## 2019-01-29 DIAGNOSIS — M533 Sacrococcygeal disorders, not elsewhere classified: Secondary | ICD-10-CM | POA: Diagnosis not present

## 2019-01-29 NOTE — Patient Instructions (Signed)
   Bring your left foot forward ~ 4-6 inches to help make the Right glute engage more to strengthen it more. Do 3x10 once a day.     Keep knees at ~ 90 degrees to not let your hamstrings or glutes takeover. Squeeze your heels together to engage your deep hip external rotators. Hold for 5 seconds and release, repeat 2x10.

## 2019-01-29 NOTE — Therapy (Signed)
[place] MAIN [facility] SERVICES [address] [place], [place], [address] Phone: [phone]   Fax:  [phone]  Physical Therapy Treatment  Patient Details  Name: Jessica Finley MRN: [id number] Date of Birth: 1996-11-09 Referring Provider (PT): Marolyn Hammock   Encounter Date: 01/29/2019  PT End of Session - 01/29/19 1222    Visit Number  6    Number of Visits  12    Date for PT Re-Evaluation  03/16/19    Authorization - Visit Number  6    Authorization - Number of Visits  12    PT Start Time  0263    PT Stop Time  1208    PT Time Calculation (min)  60 min    Activity Tolerance  Patient tolerated treatment well;No increased pain    Behavior During Therapy  WFL for tasks assessed/performed       Past Medical History:  Diagnosis Date  . Recurrent tonsillitis 12/2017    Past Surgical History:  Procedure Laterality Date  . TONSILLECTOMY N/A 01/27/2018   Procedure: TONSILLECTOMY;  Surgeon: Izora Gala, MD;  Location: [place];  Service: ENT;  Laterality: N/A;  . WISDOM TOOTH EXTRACTION      There were no vitals filed for this visit.      Pelvic Floor Physical Therapy Treatment Note  SCREENING  Changes in medications, allergies, or medical history?: no   SUBJECTIVE  Patient reports: She is not having any pain with intercourse and has only had pain 2 times over the past week, not sure what is different except she has been doing the exercises.  Precautions:  hypermobile  Pain update:  Location of pain: hip Current pain:  0/10  Max pain:  5/10 Least pain:  0/10 Nature of pain: achy  Patient Goals: Not to have pain with intercourse or at rest and be able to exercise.   OBJECTIVE  Changes in: Posture/Observations:  Rib flare, R anterior rotation  Range of Motion/Flexibilty:  Decreased sacral mobility on L>R. Hip EXT more restricted on R than L. Trend toward R abduction from tension in  lateral quads.  Palpation: TTP to R vastus lateralis.  INTERVENTIONS THIS SESSION: Manual: Performed PA mobs to B sacral borders to improve mobility and allow for MET correction  Of pelvic rotation. Performed MET correction for R anterior rotation and MWM into ER on L and flex/ext B to improve bony alignment of the pelvis and decrease pressure on the lumbosacral nerves to decrease spasm and pain. Performed hold-relax lengthening of hip-flexors in prone and supine to decrease anterior pelvic tilt.Marland Kitchen NM re-ed: Educated on and practiced biased bridges with and without cue for abduction and R heel-press with cues to engage R glute with functional motion and decrease anterior rotation of the R innominate. Educated on how to recruit obliques to decrease rib-flare and hyperlordotic posture. Therex: educated on and practiced prone heel-squeeze to strengthen deep hip ERs and reviewed importance of glute engagement with squats as well as performing HEP exercises before workouts to encourage correct recruitment with exercise routine.    Total time: 60 min.                        PT Education - 01/29/19 1221    Education Details  See Interventions this session    Person(s) Educated  Patient    Methods  Explanation;Demonstration;Verbal cues;Tactile cues;Handout    Comprehension  Verbalized understanding;Returned demonstration;Verbal cues required;Tactile cues  required;Need further instruction       PT Short Term Goals - 12/22/18 1119      PT SHORT TERM GOAL #1   Title  Patient will demonstrate improved pelvic alignment and balance of musculature surrounding the pelvis to facilitate decreased PFM spasms and decrease pelvic pain.    Baseline  R anterior rotation of innominate, R L5 partial sacrilization    Time  6    Period  Weeks    Status  New    Target Date  02/02/19      PT SHORT TERM GOAL #2   Title  Patient will demonstrate HEP x1 in the clinic to demonstrate understanding  and proper form to allow for further improvement.    Baseline  Pt. lacks understanding of what therapeutic exercises will help reduce her pain.    Time  6    Period  Weeks    Status  New    Target Date  02/02/19      PT SHORT TERM GOAL #3   Title  Patient will report a reduction in pain to no greater than 4/10 over the prior week to demonstrate symptom improvement.    Baseline  max pain of 7/10    Time  6    Period  Weeks    Status  New    Target Date  02/02/19        PT Long Term Goals - 12/22/18 1123      PT LONG TERM GOAL #1   Title  Patient will report no pain with intercourse to demonstrate improved functional ability.    Baseline  Pain with deep thrusting    Time  12    Period  Weeks    Status  New    Target Date  03/16/19      PT LONG TERM GOAL #2   Title  Patient will score less than or equal to 20% on the Female NIH-CPSI  and 7% on the [facility] to demonstrate a reduction in pain, urinary symptoms, and an improved quality of life.    Baseline  Female NIH-CPSI: 21/43 (49%) , PDI: 10/70 (14%)    Time  12    Period  Weeks    Status  New    Target Date  03/16/19      PT LONG TERM GOAL #3   Title  Patient will describe pain no greater than 2/10 before BM, after eating, and with increased stress to demonstrate improved functional ability.    Baseline  7/10 max pain    Time  12    Period  Weeks    Status  New    Target Date  03/16/19            Plan - 01/29/19 1222    Clinical Impression Statement  Pt. responded well to all interventions today, demonstrating improved mobility and improved recruitment of hip extensors and external rotators as well as improved lengthening of hip flexors to allow for improved pelvic alignment and decrease spasm and pain. Continue per POC.    Clinical Presentation  Stable    Clinical Decision Making  Low    Rehab Potential  Excellent    PT Frequency  1x / week    PT Duration  12 weeks    PT Treatment/Interventions  ADLs/Self Care  Home Management;Moist Heat;Functional mobility training;Therapeutic activities;Patient/family education;Therapeutic exercise;Neuromuscular re-education;Balance training;Manual techniques;Orthotic Fit/Training;Passive range of motion;Dry needling;Taping;Spinal Manipulations;Joint Manipulations    PT Next Visit Plan  re-assess  alignment and R glute recruitment/ ER engagement. Discuss potential D/C to PT pilates, use semi-reclined position/yoga mat and pec fly.     PT Home Exercise Plan  Diaphragmatic breathing and child's pose, hip-flexor stretch, MET correction for R innominate, chin-tucks, scapular retractions and mini-marches, bow-and-arrow, side-stretch, side-plank, biased bridges and prone heel squeeze.Marland Kitchen     Consulted and Agree with Plan of Care  Patient       Patient will benefit from skilled therapeutic intervention in order to improve the following deficits and impairments:  Pain, Decreased mobility, Increased muscle spasms, Impaired tone, Postural dysfunction, Decreased activity tolerance, Decreased endurance, Decreased range of motion, Decreased strength, Decreased balance  Visit Diagnosis: Other muscle spasm  Sacrococcygeal disorders, not elsewhere classified  Abnormal posture  Myalgia of pelvic floor     Problem List Patient Active Problem List   Diagnosis Date Noted  . Low serum cortisol level ([place]) 09/09/2017  . Serum estradiol <50 pg/ml 09/09/2017   Willa Rough DPT, ATC Willa Rough 01/29/2019, 12:46 PM  [place] MAIN [facility] SERVICES [address] [place], [place], [address] Phone: [phone]   Fax:  [phone]  Name: Jessica Finley MRN: [id number] Date of Birth: [birth year]

## 2019-02-06 ENCOUNTER — Ambulatory Visit: Payer: 59 | Attending: Obstetrics & Gynecology

## 2019-02-06 DIAGNOSIS — M62838 Other muscle spasm: Secondary | ICD-10-CM | POA: Diagnosis not present

## 2019-02-06 DIAGNOSIS — M533 Sacrococcygeal disorders, not elsewhere classified: Secondary | ICD-10-CM | POA: Diagnosis not present

## 2019-02-06 DIAGNOSIS — M7918 Myalgia, other site: Secondary | ICD-10-CM | POA: Diagnosis not present

## 2019-02-06 DIAGNOSIS — R293 Abnormal posture: Secondary | ICD-10-CM | POA: Diagnosis not present

## 2019-02-06 NOTE — Patient Instructions (Signed)
   Female version: Dr. Alonna Minium Premium Prostate Massager     Self Posterior Fourchette Stretching/Mobilization    1) Wash your hands and prop your body up so you can easily reach the vagina, bring hand-held mirror if desired.  2) Apply lubricant to the thumb and vaginal opening  3) Place thumb ~ 1/2 an inch into the vagina with the pad of the thumb pointed down and apply gentle pressure to the posterior fourchette.  4) Gently sweep the thumb side to side and in/out while maintaining pressure down toward the anus. Make sure the pressure is not so great that your muscles tighten up and guard, just enough to create slight discomfort.  Do this for ~ 3 min. Per night to decrease tightness and tenderness at the vaginal opening.

## 2019-02-06 NOTE — Therapy (Signed)
[place] MAIN [facility] SERVICES [address] [place], [place], [address] Phone: [phone]   Fax:  [phone]  Physical Therapy Treatment  Patient Details  Name: Jessica Finley MRN: [id number] Date of Birth: [birth year] Referring Provider (PT): Marolyn Hammock   Encounter Date: 02/06/2019  PT End of Session - 02/06/19 1227    Visit Number  7    Number of Visits  12    Date for PT Re-Evaluation  03/16/19    Authorization - Visit Number  7    Authorization - Number of Visits  12    PT Start Time  0908    PT Stop Time  1950    PT Time Calculation (min)  67 min    Activity Tolerance  Patient tolerated treatment well;No increased pain    Behavior During Therapy  WFL for tasks assessed/performed       Past Medical History:  Diagnosis Date  . Recurrent tonsillitis 12/2017    Past Surgical History:  Procedure Laterality Date  . TONSILLECTOMY N/A 01/27/2018   Procedure: TONSILLECTOMY;  Surgeon: Izora Gala, MD;  Location: [place];  Service: ENT;  Laterality: N/A;  . WISDOM TOOTH EXTRACTION      There were no vitals filed for this visit.    Pelvic Floor Physical Therapy Treatment Note  SCREENING  Changes in medications, allergies, or medical history?: none    SUBJECTIVE  Patient reports: Her pain was worse following last visit but then pretty good until she worked a long day. Was able to use improved posture to help reduce pain on subsequent days at work.  Precautions:  L L5 sacralization.  Pain update:  Location of pain: LLQ Current pain:  1/10  Max pain:  6/10 Least pain:  1/10 Nature of pain: achy  Patient Goals: Not to have pain with intercourse or at rest and be able to exercise.   OBJECTIVE  Changes in: Posture/Observations:  Rib-flare on L>R  Pelvic floor: TTP throughout with less at anterior PR/PC.   INTERVENTIONS THIS SESSION: Manual: Performed TP release internally to all  muscles on R and to all except for OI on L with some spasm remaining in posterior PR/PC remaining at end of session to decrease pain and mal-alignment. Therex: educated on and practiced chest press with band leaning against wall and drawing the ribs in with emphasis on flattening the back into the wall and maintaining alignment of head/neck to decrease rib-flare and improve posture. Self-care: educated on potential to teach self internal TP release and given information on the tool neccessary, reviewed standing posture to re-inforce appropriate balance of musculature surrounding the pelvis for decreased tension and pain.  Total time: 67 min.                         PT Education - 02/06/19 1227    Education Details  See Pt. Instructions and Interventions this session.    Person(s) Educated  Patient    Methods  Explanation;Demonstration;Verbal cues;Handout;Tactile cues    Comprehension  Verbalized understanding;Returned demonstration;Verbal cues required       PT Short Term Goals - 12/22/18 1119      PT SHORT TERM GOAL #1   Title  Patient will demonstrate improved pelvic alignment and balance of musculature surrounding the pelvis to facilitate decreased PFM spasms and decrease pelvic pain.    Baseline  R anterior rotation of innominate, R L5 partial sacrilization  Time  6    Period  Weeks    Status  New    Target Date  02/02/19      PT SHORT TERM GOAL #2   Title  Patient will demonstrate HEP x1 in the clinic to demonstrate understanding and proper form to allow for further improvement.    Baseline  Pt. lacks understanding of what therapeutic exercises will help reduce her pain.    Time  6    Period  Weeks    Status  New    Target Date  02/02/19      PT SHORT TERM GOAL #3   Title  Patient will report a reduction in pain to no greater than 4/10 over the prior week to demonstrate symptom improvement.    Baseline  max pain of 7/10    Time  6    Period  Weeks     Status  New    Target Date  02/02/19        PT Long Term Goals - 12/22/18 1123      PT LONG TERM GOAL #1   Title  Patient will report no pain with intercourse to demonstrate improved functional ability.    Baseline  Pain with deep thrusting    Time  12    Period  Weeks    Status  New    Target Date  03/16/19      PT LONG TERM GOAL #2   Title  Patient will score less than or equal to 20% on the Female NIH-CPSI  and 7% on the [facility] to demonstrate a reduction in pain, urinary symptoms, and an improved quality of life.    Baseline  Female NIH-CPSI: 21/43 (49%) , PDI: 10/70 (14%)    Time  12    Period  Weeks    Status  New    Target Date  03/16/19      PT LONG TERM GOAL #3   Title  Patient will describe pain no greater than 2/10 before BM, after eating, and with increased stress to demonstrate improved functional ability.    Baseline  7/10 max pain    Time  12    Period  Weeks    Status  New    Target Date  03/16/19            Plan - 02/06/19 1228    Clinical Impression Statement  Pt. responded well to all interventions today, demonstrating decreased PFM spasms and understanding of all education and exercises provided. Continue per POC.    Clinical Presentation  Stable    Clinical Decision Making  Low    Rehab Potential  Excellent    PT Frequency  1x / week    PT Duration  12 weeks    PT Treatment/Interventions  ADLs/Self Care Home Management;Moist Heat;Functional mobility training;Therapeutic activities;Patient/family education;Therapeutic exercise;Neuromuscular re-education;Balance training;Manual techniques;Orthotic Fit/Training;Passive range of motion;Dry needling;Taping;Spinal Manipulations;Joint Manipulations    PT Next Visit Plan  re-assess alignment and R glute recruitment/ ER engagement. Further internal TP release on L. Discuss potential D/C to PT pilates, try to give picture of wall pec fly given at prior visit.     PT Home Exercise Plan  Diaphragmatic breathing  and child's pose, hip-flexor stretch, MET correction for R innominate, chin-tucks, scapular retractions and mini-marches, bow-and-arrow, side-stretch, side-plank, biased bridges and prone heel squeeze, pec fly against wall for rib flare.Geanie Cooley and Agree with Plan of Care  Patient  Patient will benefit from skilled therapeutic intervention in order to improve the following deficits and impairments:  Pain, Decreased mobility, Increased muscle spasms, Impaired tone, Postural dysfunction, Decreased activity tolerance, Decreased endurance, Decreased range of motion, Decreased strength, Decreased balance  Visit Diagnosis: Other muscle spasm  Sacrococcygeal disorders, not elsewhere classified  Abnormal posture  Myalgia of pelvic floor     Problem List Patient Active Problem List   Diagnosis Date Noted  . Low serum cortisol level ([place]) 09/09/2017  . Serum estradiol <50 pg/ml 09/09/2017   Willa Rough DPT, ATC Willa Rough 02/06/2019, 12:34 PM  [place] MAIN [facility] SERVICES [address] [place], [place], [address] Phone: [phone]   Fax:  [phone]  Name: Nadelyn Enriques MRN: [id number] Date of Birth: 04-09-1996

## 2019-02-12 ENCOUNTER — Ambulatory Visit: Payer: 59

## 2019-02-12 DIAGNOSIS — M62838 Other muscle spasm: Secondary | ICD-10-CM | POA: Diagnosis not present

## 2019-02-12 DIAGNOSIS — R293 Abnormal posture: Secondary | ICD-10-CM | POA: Diagnosis not present

## 2019-02-12 DIAGNOSIS — M7918 Myalgia, other site: Secondary | ICD-10-CM

## 2019-02-12 DIAGNOSIS — M533 Sacrococcygeal disorders, not elsewhere classified: Secondary | ICD-10-CM

## 2019-02-12 NOTE — Patient Instructions (Signed)
Self Internal Trigger Point Relief    1) Wash your hands and prop yourself up in a way where you can easily reach the vagina. You may wish to have a small hand-held mirror near by.  2) lubricate the tool and vaginal opening using a hypoallergenic lubricant such as "slippery-stuff".   3) Slowly and gently insert the tool into the vagina using deep breaths to allow relaxation of the muscles around the tool.  4) Avoiding the "12 o-clock" region near the urethra, gently use the handle of the tool like a lever to press the angled tip of the tool onto the wall of the pelvic floor.   5) Move the tool to different areas of the pelvic floor and feel for areas that are tender called "trigger points". When you find one hold the tool still, applying just enough pressure to elicit mild discomfort and take deep belly breaths until the discomfort subsides or decreases by at least 50%.   6) Repeat the process for any trigger points you find spending between 3-10 minutes on this per night until you do not find any more trigger points or you are told otherwise by your therapist..    * Keep doing all of your exercises for  About 1 month or until your symptoms are gone for 1-2 weeks straight. Then go to "maintenance" doing your side-plank daily, and the bow-and arrow and side-stretches 2-3 times per week to maintain.

## 2019-02-12 NOTE — Therapy (Signed)
[place] MAIN [facility] SERVICES [address] [place], [place], [address] Phone: [phone]   Fax:  [phone]  Physical Therapy Treatment Note and Discharge Summary  Patient Details  Name: Jessica Finley MRN: [id number] Date of Birth: 10-11-1996 Referring Provider (PT): Marolyn Hammock   Encounter Date: 02/12/2019  PT End of Session - 02/12/19 0918    Visit Number  8    Number of Visits  12    Date for PT Re-Evaluation  03/16/19    Authorization - Visit Number  8    Authorization - Number of Visits  12    PT Start Time  0804    PT Stop Time  0904    PT Time Calculation (min)  60 min    Activity Tolerance  Patient tolerated treatment well;No increased pain    Behavior During Therapy  WFL for tasks assessed/performed       Past Medical History:  Diagnosis Date  . Recurrent tonsillitis 12/2017    Past Surgical History:  Procedure Laterality Date  . TONSILLECTOMY N/A 01/27/2018   Procedure: TONSILLECTOMY;  Surgeon: Izora Gala, MD;  Location: [place];  Service: ENT;  Laterality: N/A;  . WISDOM TOOTH EXTRACTION      There were no vitals filed for this visit.    Pelvic Floor Physical Therapy Treatment Note and Discharge Summary  SCREENING  Changes in medications, allergies, or medical history?: none    SUBJECTIVE  Patient reports: She has not had pain lasting longer than ~ 30 min. Has no pain with intercourse.  Pain update:  Location of pain: L hip Current pain:  0/10  Max pain:  4/10 Least pain:  0/10 Nature of pain: achy  Patient Goals: Not to have pain with intercourse or at rest and be able to exercise.   OBJECTIVE  Changes in: Posture/Observations:  Pelvis is level.  Palpation: TTP to B QL and L iliacus  INTERVENTIONS THIS SESSION: Manual: educated on and Pt. performed self internal TP release to internal PFM with a tool to allow for self-management of spasms at home if/when  they reoccur in the future. Performed TP release to L Iliacus and B QL to decrease spasms and pressure on nerves innervating the pelvis to continue to decrease pain in the L hip and allow for greater relief of PFM spasms long-term. Therex: reviewed HEP for clarity and discussed continued frequency and maintenance plan to manage scoliosis long-term.    Total time: 60 min.                          PT Education - 02/12/19 1423    Education Details  See Pt. Instructions and Interventions this session.    Person(s) Educated  Patient    Methods  Explanation;Tactile cues;Verbal cues;Handout    Comprehension  Verbalized understanding;Returned demonstration;Verbal cues required       PT Short Term Goals - 02/12/19 0907      PT SHORT TERM GOAL #1   Title  Patient will demonstrate improved pelvic alignment and balance of musculature surrounding the pelvis to facilitate decreased PFM spasms and decrease pelvic pain.    Baseline  R anterior rotation of innominate, R L5 partial sacrilization    Time  6    Period  Weeks    Status  Achieved    Target Date  02/02/19      PT SHORT TERM GOAL #2   Title  Patient will demonstrate HEP x1 in the clinic to demonstrate understanding and proper form to allow for further improvement.    Baseline  Pt. lacks understanding of what therapeutic exercises will help reduce her pain.    Time  6    Period  Weeks    Status  Achieved    Target Date  02/02/19      PT SHORT TERM GOAL #3   Title  Patient will report a reduction in pain to no greater than 4/10 over the prior week to demonstrate symptom improvement.    Baseline  max pain of 7/10    Time  6    Period  Weeks    Status  Achieved    Target Date  02/02/19        PT Long Term Goals - 02/12/19 0907      PT LONG TERM GOAL #1   Title  Patient will report no pain with intercourse to demonstrate improved functional ability.    Baseline  Pain with deep thrusting    Time  12     Period  Weeks    Status  Achieved    Target Date  03/15/19      PT LONG TERM GOAL #2   Title  Patient will score less than or equal to 20% on the Female NIH-CPSI  and 7% on the [facility] to demonstrate a reduction in pain, urinary symptoms, and an improved quality of life.    Baseline  Female NIH-CPSI: 21/43 (49%) , PDI: 10/70 (14%) As of 2/13: Female NIH-CPSI: 8/43(19%), [place]: 0/70    Time  12    Period  Weeks    Status  Achieved    Target Date  03/16/19      PT LONG TERM GOAL #3   Title  Patient will describe pain no greater than 2/10 before BM, after eating, and with increased stress to demonstrate improved functional ability.    Baseline  7/10 max pain, As of 2/13: Pt. has minimal pain occasionally only after eating     Time  12    Period  Weeks    Status  Achieved    Target Date  03/16/19            Plan - 02/12/19 0919    Clinical Impression Statement  Pt. demonstrates level pelvis and improved posture with some remaining rib-flare and anterior pelvic tilt. She has met all of her goals though she has had a few instances of pain increasing up to 4/10 for no more than 30 min. She responded slowly but well to TP release today to achieve greater improvement in muscular balance and she demonstrates understanding and correct performance of all exercises given to allow her to continue to improve with use of her HEP and regular exercise. Pt. feels ready to be D/C and PT agrees that she is capable of achieving maximal results on her own so we will D/C to her HEP today.     Clinical Presentation  Stable    Clinical Decision Making  Low    Rehab Potential  Excellent    PT Frequency  1x / week    PT Duration  12 weeks    PT Treatment/Interventions  ADLs/Self Care Home Management;Moist Heat;Functional mobility training;Therapeutic activities;Patient/family education;Therapeutic exercise;Neuromuscular re-education;Balance training;Manual techniques;Orthotic Fit/Training;Passive range of motion;Dry  needling;Taping;Spinal Manipulations;Joint Manipulations    PT Next Visit Plan  D/C    PT Home Exercise Plan  Diaphragmatic breathing and child's pose, hip-flexor  stretch, MET correction for R innominate, chin-tucks, scapular retractions and mini-marches, bow-and-arrow, side-stretch, side-plank, biased bridges and prone heel squeeze, pec fly against wall for rib flare.Marland Kitchen     Consulted and Agree with Plan of Care  Patient       Patient will benefit from skilled therapeutic intervention in order to improve the following deficits and impairments:  Pain, Decreased mobility, Increased muscle spasms, Impaired tone, Postural dysfunction, Decreased activity tolerance, Decreased endurance, Decreased range of motion, Decreased strength, Decreased balance  Visit Diagnosis: Other muscle spasm  Sacrococcygeal disorders, not elsewhere classified  Abnormal posture  Myalgia of pelvic floor     Problem List Patient Active Problem List   Diagnosis Date Noted  . Low serum cortisol level ([place]) 09/09/2017  . Serum estradiol <50 pg/ml 09/09/2017   Willa Rough DPT, ATC Willa Rough 02/12/2019, 9:31 AM  [place] MAIN [facility] SERVICES [address] [place], [place], [address] Phone: [phone]   Fax:  [phone]  Name: Jessica Finley MRN: [id number] Date of Birth: 1996/02/14

## 2019-06-08 DIAGNOSIS — F438 Other reactions to severe stress: Secondary | ICD-10-CM | POA: Diagnosis not present

## 2019-06-17 DIAGNOSIS — F438 Other reactions to severe stress: Secondary | ICD-10-CM | POA: Diagnosis not present

## 2019-07-02 DIAGNOSIS — F438 Other reactions to severe stress: Secondary | ICD-10-CM | POA: Diagnosis not present

## 2019-07-14 ENCOUNTER — Ambulatory Visit: Payer: 59 | Attending: Obstetrics & Gynecology

## 2019-07-14 ENCOUNTER — Other Ambulatory Visit: Payer: Self-pay

## 2019-07-14 DIAGNOSIS — R293 Abnormal posture: Secondary | ICD-10-CM | POA: Diagnosis not present

## 2019-07-14 DIAGNOSIS — M533 Sacrococcygeal disorders, not elsewhere classified: Secondary | ICD-10-CM | POA: Diagnosis not present

## 2019-07-14 DIAGNOSIS — M62838 Other muscle spasm: Secondary | ICD-10-CM | POA: Insufficient documentation

## 2019-07-14 NOTE — Patient Instructions (Signed)
   Place foam roller or towel under your upper back between your shoulder blades. Support your head with your hands, elbows forward, and gently rock back and forth and side to side to improve motion in your back.   Move the foam roller or towel up and down to a few spots in the upper back, repeating the process.     Sit at the end of the foam roller or towel roll and lie back with your spine along the length of it. Bring your arms out to the side, palms up, and take _10__ belly breaths.  Next, slide arms up overhead doing a "snow angel" motion as you breathe in and down toward your hips as you breathe out. Do this for _10__ belly breaths.     Support your head with your hands, elbows forward, bridge the hips up and breathe deeply as you roll up and down the top-half of the back relaxing into extension to mobilize the spine.   Bracing With Bridging (Hook-Lying)    With neutral spine, tighten pelvic floor and abdominals and hold. Lift bottom. Repeat _10x3__ times. Do _1-2__ times a day.  March while keeping pelvis level.  For 2 weeks do pulses on L leg for 2x15 (R leg up.)

## 2019-07-14 NOTE — Therapy (Signed)
[place] [facility] [address] [place], [place], [address] Phone: [phone]   Fax:  [phone]  Physical Therapy Evaluation  The patient has been informed of current processes in place at Outpatient Rehab to protect patients from Covid-19 exposure including social distancing, schedule modifications, and new cleaning procedures. After discussing their particular risk with a therapist based on the patient's personal risk factors, the patient has decided to proceed with in-person therapy.   Patient Details  Name: Jessica RussellSara Finley MRN: [id number] Date of Birth: 09/16/1996 Referring Provider (PT): Valere DrossErica Fletcher Robinson   Encounter Date: 07/14/2019  PT End of Session - 07/15/19 1825    Visit Number  1    Number of Visits  8    Date for PT Re-Evaluation  09/09/19    Authorization Type  UMR    Authorization - Visit Number  1    Authorization - Number of Visits  8    PT Start Time  1420    PT Stop Time  1530    PT Time Calculation (min)  70 min    Activity Tolerance  Patient tolerated treatment well;No increased pain    Behavior During Therapy  WFL for tasks assessed/performed       Past Medical History:  Diagnosis Date  . Recurrent tonsillitis 12/2017    Past Surgical History:  Procedure Laterality Date  . TONSILLECTOMY N/A 01/27/2018   Procedure: TONSILLECTOMY;  Surgeon: Serena Colonelosen, Jefry, MD;  Location: [facility];  Service: ENT;  Laterality: N/A;  . WISDOM TOOTH EXTRACTION      There were no vitals filed for this visit.         Pelvic Floor Physical Therapy Evaluation and Assessment  SCREENING  Falls in last 6 mo: no  Interpreter Needed?: no    Patient's communication preference:   Red Flags:  Have you had any night sweats? no Unexplained weight loss? no Saddle anesthesia? no Unexplained changes in bowel or bladder habits? no  SUBJECTIVE  Patient reports: Has been doing more Yoga, a lot of  boat pose, which is making her really sore the next day. She notices that at work if she is sitting for a long time she will have hip pain.   She broke her collar bone and started having R hip pain a year later.  Precautions:  none  Social/Family/Vocational History:  Works full time as a Therapist, [employer]  Recent Procedures/Tests/Findings:  Had a colonoscopy/endoscopy which found inflammation in stomach/ small intestinal area.  L5 partial sacralization  Obstetrical History: none  Gynecological History: none  Urinary History: none  Gastrointestinal History: Has a BM ~ daily and sometimes multiple times a day, soft with mucus.  Sexual activity/pain: Mild pain with deep thrusting, not as bad as before, knows she needs to do internal TP release.  Location of pain: L hip Current pain:  3/10  Max pain:  4/10 Least pain:  0/10 Nature of pain: achy   Patient Goals: Not have hip pain   OBJECTIVE  Posture/Observations:  Sitting:  Standing: PSIS and ASIS level with heel-lift in  Palpation/Segmental Motion/Joint Play: TTP to L>R pectineus and surrounding rotator cuff and Pec Maj, upper trap and teres major on R. Fascial restriction around R scapula.  Special tests/ Range of Motion/Flexibilty: FMS rotation: R rotation limited by ~15 degrees compared to L. Feels like greater tightness. FMS overhead squat: R shoulder feels tighter and flexed ~ 10 degrees from ear-level at bottom of squat. R  shoulder restricted in all ROM by ~ 10 degrees Scouring test: positive for R labral tear evident near extension in FABER position  Strength/MMT:  LE MMT  LE MMT Left Right  Hip flex:  (L2) /5 /5  Hip ext: 4++/5 5/5  Hip abd: /5 /5  Hip add: /5 /5  Hip IR /5 /5  Hip ER /5 /5     Abdominal:  Palpation: TTP through R>L hip-flexors Diastasis:  Pelvic Floor External Exam: Deferred until deemed necessary Introitus Appears:  Skin integrity:  Palpation: Cough: Prolapse  visible?: Scar mobility:  Internal Vaginal Exam: Strength (PERF):  Symmetry: Palpation: Prolapse:   Internal Rectal Exam: Strength (PERF): Symmetry: Palpation: Prolapse:   Gait Analysis: WNL   Pelvic Floor Outcome Measures: Female NIH-CPSI: 13/43 (30%), PDI: 1/60 (2%)  INTERVENTIONS THIS SESSION: Manual: Performed TP release to L Psoas to decrease pain and spasm.  Self-care: Educated on myofacial anatomy trains and how fascial restriction could be causing her pain as well as the POC. Therex: educated on and practiced thoracic extensions over a towel roll and "snow-angels" to improve thoracic mobility and decrease fascial restriction in R shoulder to free up the spiral chain.   Total time: 70 min.            Objective measurements completed on examination: See above findings.                PT Short Term Goals - 07/15/19 1833      PT SHORT TERM GOAL #1   Title  Patient will demonstrate improved balance of musculature surrounding the pelvis to facilitate decreased pain.    Baseline  Spasms surrounding the pelvis    Time  4    Period  Weeks    Status  New    Target Date  08/12/19      PT SHORT TERM GOAL #2   Title  Patient will demonstrate HEP x1 in the clinic to demonstrate understanding and proper form to allow for further improvement.    Baseline  Pt. lacks understanding of what therapeutic exercises will help reduce her pain.    Time  4    Period  Weeks    Status  New    Target Date  08/12/19      PT SHORT TERM GOAL #3   Title  Patient will report a reduction in pain to no greater than 2/10 over the prior week to demonstrate symptom improvement.    Baseline  Max of 4/10    Time  4    Period  Weeks    Status  New    Target Date  08/12/19        PT Long Term Goals - 07/15/19 1835      PT LONG TERM GOAL #1   Title  Pt. will demonstrate AROM through R shoulder that is comprable to L shoulder to demonstrate improved balance and  decreased restriction of fascial obility that is impacting L hip.    Baseline  ~ 10 degrees less motion in all planes with TTP surrounding the scapula and anterior chest.    Time  8    Period  Weeks    Status  New    Target Date  09/09/19      PT LONG TERM GOAL #2   Title  Patient will score less than or equal to 10% on the Female NIH-CPSI  and 0% [facility] the [facility] to demonstrate a reduction in pain, urinary symptoms, and an  improved quality of life.    Baseline  13/43 (30%), PDI: 1/60 (2%)    Time  8    Period  Weeks    Status  New    Target Date  09/09/19      PT LONG TERM GOAL #3   Title  Patient will describe no pain after sitting or laying down for>1 hr to demonstrate improved functional ability.    Baseline  Pain increased with prolonged sitting/ laying to 4/10    Time  8    Period  Weeks    Status  New    Target Date  09/09/19             Plan - 07/15/19 1826    Clinical Impression Statement  Pt. is a 23 y/o female who presents today with cheif c/o L hip pain. Her relevant PMH includes minor leg-length difference. Her clinical exam revealed decreased myofascial mobility stemming from an old collar bone break, a L labral tear, as well as spasms surrounding the pelvis and muscular imbalance leading to lower crossed syndrome. She will benefit from skilled pelvif floor PT to address the noted defecits and to continue to assess for and address other potential causes of pain.    Personal Factors and Comorbidities  Comorbidity 1    Comorbidities  leg-length discrepancy    Examination-Activity Limitations  Other   exercise   Stability/Clinical Decision Making  Evolving/Moderate complexity    Clinical Decision Making  Moderate    Rehab Potential  Good    PT Frequency  1x / week    PT Duration  8 weeks    PT Treatment/Interventions  ADLs/Self Care Home Management;Moist Heat;Gait training;Therapeutic exercise;Therapeutic activities;Functional mobility training;Neuromuscular  re-education;Patient/family education;Manual techniques;Passive range of motion;Dry needling;Taping;Spinal Manipulations;Joint Manipulations    PT Next Visit Plan  improve mobility/decrease spasms in R shoulder TEST RE-TEST!    PT Home Exercise Plan  snow angel, thoracic extensions    Consulted and Agree with Plan of Care  Patient       Patient will benefit from skilled therapeutic intervention in order to improve the following deficits and impairments:  Increased muscle spasms, Pain, Postural dysfunction  Visit Diagnosis: 1. Other muscle spasm   2. Abnormal posture        Problem List Patient Active Problem List   Diagnosis Date Noted  . Low serum cortisol level ([place]) 09/09/2017  . Serum estradiol <50 pg/ml 09/09/2017   Willa Rough DPT, ATC Willa Rough 07/15/2019, 6:41 PM  [place] MAIN [facility] SERVICES [address] [place], [place], [address] Phone: [phone]   Fax:  [phone]  Name: Jessica Finley MRN: [id number] Date of Birth: 06-17-1996

## 2019-07-23 ENCOUNTER — Ambulatory Visit: Payer: 59

## 2019-07-23 ENCOUNTER — Other Ambulatory Visit: Payer: Self-pay

## 2019-07-23 DIAGNOSIS — R293 Abnormal posture: Secondary | ICD-10-CM | POA: Diagnosis not present

## 2019-07-23 DIAGNOSIS — M533 Sacrococcygeal disorders, not elsewhere classified: Secondary | ICD-10-CM

## 2019-07-23 DIAGNOSIS — M62838 Other muscle spasm: Secondary | ICD-10-CM

## 2019-07-23 NOTE — Therapy (Signed)
[place] MAIN [facility] SERVICES [address] [place], [place], [address] Phone: [phone]   Fax:  [phone]  Physical Therapy Treatment  The patient has been informed of current processes in place at Outpatient Rehab to protect patients from Covid-19 exposure including social distancing, schedule modifications, and new cleaning procedures. After discussing their particular risk with a therapist based on the patient's personal risk factors, the patient has decided to proceed with in-person therapy.   Patient Details  Name: Jessica Finley MRN: [id number] Date of Birth: 05/16/1996 Referring Provider (PT): Marolyn Hammock   Encounter Date: 07/23/2019  PT End of Session - 07/24/19 0952    Visit Number  2    Number of Visits  8    Date for PT Re-Evaluation  09/09/19    Authorization Type  UMR    Authorization - Visit Number  2    Authorization - Number of Visits  8    PT Start Time  2751    PT Stop Time  1630    PT Time Calculation (min)  55 min    Activity Tolerance  Patient tolerated treatment well;No increased pain    Behavior During Therapy  WFL for tasks assessed/performed       Past Medical History:  Diagnosis Date  . Recurrent tonsillitis 12/2017    Past Surgical History:  Procedure Laterality Date  . TONSILLECTOMY N/A 01/27/2018   Procedure: TONSILLECTOMY;  Surgeon: Izora Gala, MD;  Location: [place];  Service: ENT;  Laterality: N/A;  . WISDOM TOOTH EXTRACTION      There were no vitals filed for this visit.      Pelvic Floor Physical Therapy Treatment Note  SCREENING  Changes in medications, allergies, or medical history?: no    SUBJECTIVE  Patient reports: Had pain mostly on Saturday at work when she sat for any length of time. Has not been working as much over the past week.  Precautions:  L L5 partial sacralization  Pain update:  Location of pain: B anterior hips. Current pain:   0/10  Max pain:  5/10 Least pain:  0/10 Nature of pain: sore/ache  Patient Goals: Not have hip pain   OBJECTIVE  Changes in: Posture/Observations:  R hip outflare   Range of Motion/Flexibilty:  Decreased mobility at R sacral border. Decreased R shoulder ABD, Flex, scapular mobility.  Palpation: TTP to R Hamstring origin    INTERVENTIONS THIS SESSION: Manual: Performed grade 3-4 PA sacral mobs at R border, TP release to R Hamstrings near origin and R Pec. Major near clavicular head, as well as MFR to R lateral/inferior scapular border and torso to improve mobility of the spiral fascial chain to allow for improved balance of movement and decreased recurrence of B hip-pain. Self-care: encouraged Pt. To stop doing dead-lifts for 2 weeks to allow for healing at ischial tuberosity.   Total time: 55 min.                         PT Short Term Goals - 07/15/19 1833      PT SHORT TERM GOAL #1   Title  Patient will demonstrate improved balance of musculature surrounding the pelvis to facilitate decreased pain.    Baseline  Spasms surrounding the pelvis    Time  4    Period  Weeks    Status  New    Target Date  08/12/19      PT  SHORT TERM GOAL #2   Title  Patient will demonstrate HEP x1 in the clinic to demonstrate understanding and proper form to allow for further improvement.    Baseline  Pt. lacks understanding of what therapeutic exercises will help reduce her pain.    Time  4    Period  Weeks    Status  New    Target Date  08/12/19      PT SHORT TERM GOAL #3   Title  Patient will report a reduction in pain to no greater than 2/10 over the prior week to demonstrate symptom improvement.    Baseline  Max of 4/10    Time  4    Period  Weeks    Status  New    Target Date  08/12/19        PT Long Term Goals - 07/15/19 1835      PT LONG TERM GOAL #1   Title  Pt. will demonstrate AROM through R shoulder that is comprable to L shoulder to demonstrate  improved balance and decreased restriction of fascial obility that is impacting L hip.    Baseline  ~ 10 degrees less motion in all planes with TTP surrounding the scapula and anterior chest.    Time  8    Period  Weeks    Status  New    Target Date  09/09/19      PT LONG TERM GOAL #2   Title  Patient will score less than or equal to 10% on the Female NIH-CPSI  and 0% on the [facility] to demonstrate a reduction in pain, urinary symptoms, and an improved quality of life.    Baseline  13/43 (30%), PDI: 1/60 (2%)    Time  8    Period  Weeks    Status  New    Target Date  09/09/19      PT LONG TERM GOAL #3   Title  Patient will describe no pain after sitting or laying down for>1 hr to demonstrate improved functional ability.    Baseline  Pain increased with prolonged sitting/ laying to 4/10    Time  8    Period  Weeks    Status  New    Target Date  09/09/19            Plan - 07/24/19 0953    Clinical Impression Statement  Pt. Responded well to all interventions today, demonstrating improved Sacral mobility, fascial mobility, and decreased spasm and tenderness to facilitate improved body mechanics and decreased pain with exercise and prolonged sitting. They will continue to benefit from skilled physical therapy to work toward remaining goals and maximize function as well as decrease likelihood of symptom increase or recurrence.    Personal Factors and Comorbidities  Comorbidity 1    Comorbidities  leg-length discrepancy    Examination-Activity Limitations  Other   exercise   Stability/Clinical Decision Making  Evolving/Moderate complexity    Rehab Potential  Good    PT Frequency  1x / week    PT Duration  8 weeks    PT Treatment/Interventions  ADLs/Self Care Home Management;Moist Heat;Gait training;Therapeutic exercise;Therapeutic activities;Functional mobility training;Neuromuscular re-education;Patient/family education;Manual techniques;Passive range of motion;Dry  needling;Taping;Spinal Manipulations;Joint Manipulations    PT Next Visit Plan  improve mobility/decrease spasms in R shoulder TEST RE-TEST! and mobilize to decrease R out-flare    PT Home Exercise Plan  snow angel, thoracic extensions    Consulted and Agree with Plan of Care  Patient       Patient will benefit from skilled therapeutic intervention in order to improve the following deficits and impairments:  Increased muscle spasms, Pain, Postural dysfunction  Visit Diagnosis: 1. Other muscle spasm   2. Abnormal posture   3. Sacrococcygeal disorders, not elsewhere classified        Problem List Patient Active Problem List   Diagnosis Date Noted  . Low serum cortisol level ([facility]) 09/09/2017  . Serum estradiol <50 pg/ml 09/09/2017   Cleophus MoltKeeli T. Eldonna Neuenfeldt DPT, ATC Cleophus MoltKeeli T Claud Gowan 07/24/2019, 9:56 AM  [place] [facility] [address] [place], [place], [address] Phone: [phone]   Fax:  [phone]  Name: Jessica Finley MRN: [id number] Date of Birth: 09/05/1996

## 2019-08-06 ENCOUNTER — Ambulatory Visit: Payer: 59

## 2019-08-06 ENCOUNTER — Other Ambulatory Visit: Payer: Self-pay

## 2019-08-06 ENCOUNTER — Ambulatory Visit: Payer: 59 | Attending: Obstetrics & Gynecology

## 2019-08-06 DIAGNOSIS — R293 Abnormal posture: Secondary | ICD-10-CM | POA: Insufficient documentation

## 2019-08-06 DIAGNOSIS — M7918 Myalgia, other site: Secondary | ICD-10-CM | POA: Insufficient documentation

## 2019-08-06 DIAGNOSIS — M533 Sacrococcygeal disorders, not elsewhere classified: Secondary | ICD-10-CM | POA: Insufficient documentation

## 2019-08-06 DIAGNOSIS — M62838 Other muscle spasm: Secondary | ICD-10-CM | POA: Insufficient documentation

## 2019-08-06 NOTE — Therapy (Addendum)
[facility]IN [facility] [address] [place], [place], [address] Phone: [phone]   Fax:  [phone]  Physical Therapy Treatment  The patient has been informed of current processes in place at Outpatient Rehab to protect patients from Covid-19 exposure including social distancing, schedule modifications, and new cleaning procedures. After discussing their particular risk with a therapist based on the patient's personal risk factors, the patient has decided to proceed with in-person therapy.   Patient Details  Name: Jessica RussellSara Finley MRN: [id number] Date of Birth: 07/15/1996 Referring Provider (PT): Valere DrossErica Fletcher Robinson   Encounter Date: 08/06/2019  PT End of Session - 08/10/19 0741    Visit Number  3    Number of Visits  8    Date for PT Re-Evaluation  09/09/19    Authorization Type  UMR    Authorization - Visit Number  3    Authorization - Number of Visits  8    PT Start Time  1725    PT Stop Time  1825    PT Time Calculation (min)  60 min    Activity Tolerance  Patient tolerated treatment well;No increased pain    Behavior During Therapy  WFL for tasks assessed/performed       Past Medical History:  Diagnosis Date  . Recurrent tonsillitis 12/2017    Past Surgical History:  Procedure Laterality Date  . TONSILLECTOMY N/A 01/27/2018   Procedure: TONSILLECTOMY;  Surgeon: Serena Colonelosen, Jefry, MD;  Location: [facility];  Service: ENT;  Laterality: N/A;  . WISDOM TOOTH EXTRACTION      There were no vitals filed for this visit.    Pelvic Floor Physical Therapy Treatment Note  SCREENING  Changes in medications, allergies, or medical history?: no    SUBJECTIVE  Patient reports: Had L hip pain with prolonged sitting in the car in posterior pelvic tilt position. The R hamstring also hurt for 1 day when working and one day when not.  Precautions:  L L5 partial sacralization  Pain update:  Location of pain: L  anterior hip Current pain:  0/10  Max pain:  6/10 Least pain:  0/10 Nature of pain: sore/ache/deep  Patient Goals: Not have hip pain   OBJECTIVE  Changes in: Posture/Observations:  L hip inflare   Range of Motion/Flexibilty:  Decreased sacral mobility through the L sacral border  Strength/MMT:  LE MMT  LE MMT Left Right  Hip flex:  (L2) /5 /5  Hip ext: 5/5 5/5  Hip abd: 5/5 4++/5  Hip add: 4+/5 4+/5  Hip IR 5/5 4+/5  Hip ER 4+/5 5/5      INTERVENTIONS THIS SESSION: Manual: Re-assessed LE strength to inform modification of HEP. Performed grade 3-4 PA sacral mobs at L border to decrease L out-flare and allow for greater balance of musculature surrounding the hip and to improve mobility of joint and surrounding connective tissue and decrease pressure on nerve roots for improved conductivity and function of down-stream tissues.  Therex: educated on and practiced hip IE/ER and adduction to improve balance of musculature surrounding the hip and prevent return of spasms/pain.  Total time: 60 min.                                PT Short Term Goals - 07/15/19 1833      PT SHORT TERM GOAL #1   Title  Patient will demonstrate improved balance of musculature  surrounding the pelvis to facilitate decreased pain.    Baseline  Spasms surrounding the pelvis    Time  4    Period  Weeks    Status  New    Target Date  08/12/19      PT SHORT TERM GOAL #2   Title  Patient will demonstrate HEP x1 in the clinic to demonstrate understanding and proper form to allow for further improvement.    Baseline  Pt. lacks understanding of what therapeutic exercises will help reduce her pain.    Time  4    Period  Weeks    Status  New    Target Date  08/12/19      PT SHORT TERM GOAL #3   Title  Patient will report a reduction in pain to no greater than 2/10 over the prior week to demonstrate symptom improvement.    Baseline  Max of 4/10    Time  4    Period   Weeks    Status  New    Target Date  08/12/19        PT Long Term Goals - 07/15/19 1835      PT LONG TERM GOAL #1   Title  Pt. will demonstrate AROM through R shoulder that is comprable to L shoulder to demonstrate improved balance and decreased restriction of fascial obility that is impacting L hip.    Baseline  ~ 10 degrees less motion in all planes with TTP surrounding the scapula and anterior chest.    Time  8    Period  Weeks    Status  New    Target Date  09/09/19      PT LONG TERM GOAL #2   Title  Patient will score less than or equal to 10% on the Female NIH-CPSI  and 0% on the [facility] to demonstrate a reduction in pain, urinary symptoms, and an improved quality of life.    Baseline  13/43 (30%), PDI: 1/60 (2%)    Time  8    Period  Weeks    Status  New    Target Date  09/09/19      PT LONG TERM GOAL #3   Title  Patient will describe no pain after sitting or laying down for>1 hr to demonstrate improved functional ability.    Baseline  Pain increased with prolonged sitting/ laying to 4/10    Time  8    Period  Weeks    Status  New    Target Date  09/09/19            Plan - 08/10/19 0741    Clinical Impression Statement  Pt. Responded well to all interventions today, demonstrating improved sacral mobility and pelvic alignment as well as understanding and correct performance of all education and exercises provided today. They will continue to benefit from skilled physical therapy to work toward remaining goals and maximize function as well as decrease likelihood of symptom increase or recurrence.     Personal Factors and Comorbidities  Comorbidity 1    Comorbidities  leg-length discrepancy    Examination-Activity Limitations  Other   exercise   Stability/Clinical Decision Making  Evolving/Moderate complexity    Rehab Potential  Good    PT Frequency  1x / week    PT Duration  8 weeks    PT Treatment/Interventions  ADLs/Self Care Home Management;Moist Heat;Gait  training;Therapeutic exercise;Therapeutic activities;Functional mobility training;Neuromuscular re-education;Patient/family education;Manual techniques;Passive range of motion;Dry needling;Taping;Spinal Manipulations;Joint Manipulations  PT Next Visit Plan  improve mobility/decrease spasms in R shoulder    PT Home Exercise Plan  snow angel, thoracic extensions, hip IR and ER    Consulted and Agree with Plan of Care  Patient       Patient will benefit from skilled therapeutic intervention in order to improve the following deficits and impairments:  Increased muscle spasms, Pain, Postural dysfunction  Visit Diagnosis: 1. Other muscle spasm   2. Abnormal posture   3. Sacrococcygeal disorders, not elsewhere classified   4. Myalgia of pelvic floor        Problem List Patient Active Problem List   Diagnosis Date Noted  . Low serum cortisol level ([place]) 09/09/2017  . Serum estradiol <50 pg/ml 09/09/2017   Willa Rough DPT, ATC Willa Rough 08/10/2019, 7:43 AM  [place] MAIN [facility] SERVICES [address] [place], [place], [address] Phone: [phone]   Fax:  [phone]  Name: Jessica Finley MRN: [id number] Date of Birth: 06/10/1996

## 2019-08-06 NOTE — Patient Instructions (Signed)
  Bring Right leg to the middle, do 15x2   Bring Left leg away from the middle, do 2x15   Do 3x10 on each side

## 2019-08-13 ENCOUNTER — Ambulatory Visit: Payer: 59

## 2019-08-20 ENCOUNTER — Ambulatory Visit: Payer: 59

## 2019-08-20 ENCOUNTER — Other Ambulatory Visit: Payer: Self-pay

## 2019-08-20 DIAGNOSIS — R293 Abnormal posture: Secondary | ICD-10-CM

## 2019-08-20 DIAGNOSIS — M533 Sacrococcygeal disorders, not elsewhere classified: Secondary | ICD-10-CM | POA: Diagnosis not present

## 2019-08-20 DIAGNOSIS — M62838 Other muscle spasm: Secondary | ICD-10-CM | POA: Diagnosis not present

## 2019-08-20 DIAGNOSIS — M7918 Myalgia, other site: Secondary | ICD-10-CM | POA: Diagnosis not present

## 2019-08-20 NOTE — Therapy (Signed)
[place] MAIN [facility] SERVICES [address] [place], [place], [address] Phone: [phone]   Fax:  [phone]  Physical Therapy Treatment  The patient has been informed of current processes in place at Outpatient Rehab to protect patients from Covid-19 exposure including social distancing, schedule modifications, and new cleaning procedures. After discussing their particular risk with a therapist based on the patient's personal risk factors, the patient has decided to proceed with in-person therapy.   Patient Details  Name: Jessica Finley MRN: [id number] Date of Birth: 1996/07/07 Referring Provider (PT): Marolyn Hammock   Encounter Date: 08/20/2019  PT End of Session - 08/21/19 1434    Visit Number  4    Number of Visits  8    Date for PT Re-Evaluation  09/09/19    Authorization Type  UMR    Authorization - Visit Number  4    Authorization - Number of Visits  8    PT Start Time  0947    PT Stop Time  0962    PT Time Calculation (min)  56 min    Activity Tolerance  Patient tolerated treatment well;No increased pain    Behavior During Therapy  WFL for tasks assessed/performed       Past Medical History:  Diagnosis Date  . Recurrent tonsillitis 12/2017    Past Surgical History:  Procedure Laterality Date  . TONSILLECTOMY N/A 01/27/2018   Procedure: TONSILLECTOMY;  Surgeon: Izora Gala, MD;  Location: [place];  Service: ENT;  Laterality: N/A;  . WISDOM TOOTH EXTRACTION      There were no vitals filed for this visit.    Pelvic Floor Physical Therapy Treatment Note  SCREENING  Changes in medications, allergies, or medical history?: no    SUBJECTIVE  Patient reports: Had soreness in glutes/hamstrings following last session, she did ab exercises with boat pose, sore the next day and it went away. Has the most pain today after she has done upper body exercise and her HEP. Notices that when she sits for a  long period it gets achy.  Precautions:  L L5 partial sacralization  Pain update:  Location of pain: L anterior hip Current pain:  4/10  Max pain:  4/10 Least pain:  0/10 Nature of pain: sore/ache/deep  **0/10 pain following session.  Patient Goals: Not have hip pain   OBJECTIVE  Changes in: Posture/Observations:  Minor L anterior rotation   Range of Motion/Flexibilty:  Appropriate motion at all sacral borders w/o pain.  Strength/MMT:  LE MMT  LE MMT Left Right  Hip flex:  (L2) /5 /5  Hip ext: 5/5 5/5  Hip abd: 5/5 5/5  Hip add: 4++/5 4++/5  Hip IR 5/5 4++/5  Hip ER 4++/5 5/5   Palpation: TTP to HS origin on L, lumbar multifidus ~ L2 and L iliopsoas    INTERVENTIONS THIS SESSION: Manual: Re-assessed LE strength to determine outcome of of HEP. Performed TP release to L HS origin, Lumbar multifidus and iliopsoas near insertion to improve balance surrounding the pelvis and alignment and decrease pain. Re-measured leg-length at Pt. Request and found consistency of .5 cm leg-length difference remains.  Therex: Reviewed HEP and discussed avoiding hip-flexor engaging exercises like boat pose to prevent increased pain while we are attempting to improve balance of musculature. Reminded to continue doing hip-flexor stretches daily.   Total time: 60 min.  PT Short Term Goals - 07/15/19 1833      PT SHORT TERM GOAL #1   Title  Patient will demonstrate improved balance of musculature surrounding the pelvis to facilitate decreased pain.    Baseline  Spasms surrounding the pelvis    Time  4    Period  Weeks    Status  New    Target Date  08/12/19      PT SHORT TERM GOAL #2   Title  Patient will demonstrate HEP x1 in the clinic to demonstrate understanding and proper form to allow for further improvement.    Baseline  Pt. lacks understanding of what therapeutic exercises will help reduce her pain.    Time  4     Period  Weeks    Status  New    Target Date  08/12/19      PT SHORT TERM GOAL #3   Title  Patient will report a reduction in pain to no greater than 2/10 over the prior week to demonstrate symptom improvement.    Baseline  Max of 4/10    Time  4    Period  Weeks    Status  New    Target Date  08/12/19        PT Long Term Goals - 07/15/19 1835      PT LONG TERM GOAL #1   Title  Pt. will demonstrate AROM through R shoulder that is comprable to L shoulder to demonstrate improved balance and decreased restriction of fascial obility that is impacting L hip.    Baseline  ~ 10 degrees less motion in all planes with TTP surrounding the scapula and anterior chest.    Time  8    Period  Weeks    Status  New    Target Date  09/09/19      PT LONG TERM GOAL #2   Title  Patient will score less than or equal to 10% on the Female NIH-CPSI  and 0% on the [facility] to demonstrate a reduction in pain, urinary symptoms, and an improved quality of life.    Baseline  13/43 (30%), PDI: 1/60 (2%)    Time  8    Period  Weeks    Status  New    Target Date  09/09/19      PT LONG TERM GOAL #3   Title  Patient will describe no pain after sitting or laying down for>1 hr to demonstrate improved functional ability.    Baseline  Pain increased with prolonged sitting/ laying to 4/10    Time  8    Period  Weeks    Status  New    Target Date  09/09/19            Plan - 08/21/19 1435    Clinical Impression Statement  Pt. Responded well to all interventions today, demonstrating improved pelvic alignment and decreased spasm and pain as well as understanding and correct performance of all education and exercises provided today. They will continue to benefit from skilled physical therapy to work toward remaining goals and maximize function as well as decrease likelihood of symptom increase or recurrence.     Personal Factors and Comorbidities  Comorbidity 1    Comorbidities  leg-length discrepancy     Examination-Activity Limitations  Other   exercise   Stability/Clinical Decision Making  Evolving/Moderate complexity    Rehab Potential  Good    PT Frequency  1x / week  PT Duration  8 weeks    PT Treatment/Interventions  ADLs/Self Care Home Management;Moist Heat;Gait training;Therapeutic exercise;Therapeutic activities;Functional mobility training;Neuromuscular re-education;Patient/family education;Manual techniques;Passive range of motion;Dry needling;Taping;Spinal Manipulations;Joint Manipulations    PT Next Visit Plan  improve mobility/decrease spasms in R shoulder    PT Home Exercise Plan  snow angel, thoracic extensions, hip IR and ER    Consulted and Agree with Plan of Care  Patient       Patient will benefit from skilled therapeutic intervention in order to improve the following deficits and impairments:  Increased muscle spasms, Pain, Postural dysfunction  Visit Diagnosis: Other muscle spasm  Abnormal posture  Sacrococcygeal disorders, not elsewhere classified     Problem List Patient Active Problem List   Diagnosis Date Noted  . Low serum cortisol level ([facility]) 09/09/2017  . Serum estradiol <50 pg/ml 09/09/2017   Cleophus MoltKeeli T. Adlean Hardeman DPT, ATC Cleophus MoltKeeli T Sayvion Vigen 08/21/2019, 2:37 PM  [place] [facility] MAIN [facility] [address] [place], [place], [address] Phone: [phone]   Fax:  [phone]  Name: Jessica RussellSara Finley MRN: [id number] Date of Birth: 10/05/1996

## 2019-08-27 ENCOUNTER — Ambulatory Visit: Payer: 59

## 2019-08-31 ENCOUNTER — Other Ambulatory Visit: Payer: Self-pay

## 2019-08-31 ENCOUNTER — Ambulatory Visit: Payer: 59

## 2019-08-31 DIAGNOSIS — M533 Sacrococcygeal disorders, not elsewhere classified: Secondary | ICD-10-CM | POA: Diagnosis not present

## 2019-08-31 DIAGNOSIS — R293 Abnormal posture: Secondary | ICD-10-CM | POA: Diagnosis not present

## 2019-08-31 DIAGNOSIS — M62838 Other muscle spasm: Secondary | ICD-10-CM | POA: Diagnosis not present

## 2019-08-31 DIAGNOSIS — M7918 Myalgia, other site: Secondary | ICD-10-CM | POA: Diagnosis not present

## 2019-08-31 NOTE — Patient Instructions (Signed)
  When seated, you want to maintain pelvic neutral with the shoulders gently down and back and ears in line with your shoulders. A lumbar roll such as the one below or a home-made towel-roll can be used for this purpose. Even Olympic athletes can only maintain proper seated posture for about 10 minutes without support!    Pictured: The Original [employer] Lumbar Roll  

## 2019-08-31 NOTE — Therapy (Signed)
[place] [facility] [address] [place], [place], [address] Phone: [phone]   Fax:  [phone]  Physical Therapy Treatment  The patient has been informed of current processes in place at Outpatient Rehab to protect patients from Covid-19 exposure including social distancing, schedule modifications, and new cleaning procedures. After discussing their particular risk with a therapist based on the patient's personal risk factors, the patient has decided to proceed with in-person therapy.   Patient Details  Name: Jessica RussellSara Finley MRN: [id number] Date of Birth: 08/16/1996 Referring Provider (PT): Valere DrossErica Fletcher Robinson   Encounter Date: 08/31/2019  PT End of Session - 08/31/19 1455    Visit Number  5    Number of Visits  8    Date for PT Re-Evaluation  09/09/19    Authorization Type  UMR    Authorization - Visit Number  5    Authorization - Number of Visits  8    PT Start Time  1230    PT Stop Time  1330    PT Time Calculation (min)  60 min    Activity Tolerance  Patient tolerated treatment well    Behavior During Therapy  [facility] for tasks assessed/performed       Past Medical History:  Diagnosis Date  . Recurrent tonsillitis 12/2017    Past Surgical History:  Procedure Laterality Date  . TONSILLECTOMY N/A 01/27/2018   Procedure: TONSILLECTOMY;  Surgeon: Serena Colonelosen, Jefry, MD;  Location: [facility];  Service: ENT;  Laterality: N/A;  . WISDOM TOOTH EXTRACTION      There were no vitals filed for this visit.   Pelvic Floor Physical Therapy Treatment Note  SCREENING  Changes in medications, allergies, or medical history?: no    SUBJECTIVE  Patient reports: Has had more pain in L hip, only with prolonged sitting (~ 1 hr. In) . Had R glute/hip pain once when sitting. Sits with her feet curled up under her leaning to the L or long-sitting. Sits on her feet a lot at work too  Precautions:  L L5 partial  sacralization  Pain update:  Location of pain: L anterior hip Current pain:  0/10  Max pain:  7.5/10 Least pain:  0/10 Nature of pain: sore/ache/deep  **0/10 pain following session.  Patient Goals: Not have hip pain   OBJECTIVE  Changes in: Palpation: TTP to L QL, radiates to L hip "normal pain"  Range of Motion/Flexibilty:  Decreased R overhead reach compared to L.  Strength/MMT:  LE MMT  LE MMT Left Right  Hip flex:  (L2) /5 /5  Hip ext: 5/5 5/5  Hip abd: 5/5 5/5  Hip add: 4++/5 4++/5  Hip IR 5/5 4+++/5  Hip ER 5/5 5/5     INTERVENTIONS THIS SESSION: Manual: Performed TP release and STM to L QL to to decrease spasm and pain and allow for improved balance of musculature for improved function and decreased symptoms. Dry-needle: Performed TPDN with a .30x7260mm needle as described below to decrease spasm and pain and allow for improved balance of musculature for improved function and decreased symptoms. Theract: Educated on seated posture and how to use external support in the car, at work, and on the couch to decrease pain increasing in L hip after prolonged sitting.  Total time: 60 min.                      Trigger Point Dry Needling - 08/31/19 0001  Consent Given?  Yes    Education Handout Provided  No    Muscles Treated Back/Hip  Quadratus lumborum    Dry Needling Comments  --   Left   Quadratus Lumborum Response  Twitch response elicited;Palpable increased muscle length             PT Short Term Goals - 08/31/19 1456      PT SHORT TERM GOAL #1   Title  Patient will demonstrate improved balance of musculature surrounding the pelvis to facilitate decreased pain.    Baseline  Spasms surrounding the pelvis    Time  4    Period  Weeks    Status  Achieved    Target Date  08/12/19      PT SHORT TERM GOAL #2   Title  Patient will demonstrate HEP x1 in the clinic to demonstrate understanding and proper form to allow for further  improvement.    Baseline  Pt. lacks understanding of what therapeutic exercises will help reduce her pain.    Time  4    Period  Weeks    Status  Achieved    Target Date  08/12/19      PT SHORT TERM GOAL #3   Title  Patient will report a reduction in pain to no greater than 2/10 over the prior week to demonstrate symptom improvement.    Baseline  Max of 4/10    Time  4    Period  Weeks    Status  On-going    Target Date  09/28/19        PT Long Term Goals - 08/31/19 1457      PT LONG TERM GOAL #1   Title  Pt. will demonstrate AROM through R shoulder that is comprable to L shoulder to demonstrate improved balance and decreased restriction of fascial obility that is impacting L hip.    Baseline  ~ 10 degrees less motion in all planes with TTP surrounding the scapula and anterior chest.    Time  8    Period  Weeks    Status  On-going    Target Date  10/26/19      PT LONG TERM GOAL #2   Title  Patient will score less than or equal to 10% on the Female NIH-CPSI  and 0% on the [facility] to demonstrate a reduction in pain, urinary symptoms, and an improved quality of life.    Baseline  13/43 (30%), PDI: 1/60 (2%)    Time  8    Period  Weeks    Status  On-going    Target Date  10/26/19      PT LONG TERM GOAL #3   Title  Patient will describe no pain after sitting or laying down for>1 hr to demonstrate improved functional ability.    Baseline  Pain increased with prolonged sitting/ laying to 4/10    Time  8    Period  Weeks    Status  New    Target Date  10/26/19            Plan - 08/31/19 1456    Clinical Impression Statement  Pt. continues to have pain, only with prolonged sitting and demonstrated a L up-slip today which was corrected by treatment. It was discovered that the cause of the Pt's continued mal-alignment is her sitting with poor posture, feet tucked under her in combination with her hypermobility. Pt. demonstrated decreased spasm and pain following this session but  will benefit from follow-up on an every-other week basis to continue to manage spasms while balancing strength and continue to assess for behavioral modifications that may be necessary to allow for sustained pain relief upon D/C.    Personal Factors and Comorbidities  Comorbidity 1    Comorbidities  leg-length discrepancy    Examination-Activity Limitations  Other   exercise   Stability/Clinical Decision Making  Evolving/Moderate complexity    Clinical Decision Making  Moderate    Rehab Potential  Good    PT Frequency  Biweekly    PT Duration  8 weeks    PT Treatment/Interventions  ADLs/Self Care Home Management;Moist Heat;Gait training;Therapeutic exercise;Therapeutic activities;Functional mobility training;Neuromuscular re-education;Patient/family education;Manual techniques;Passive range of motion;Dry needling;Taping;Spinal Manipulations;Joint Manipulations    PT Next Visit Plan  improve mobility/decrease spasms in R shoulder, re-assess pelvic alignment.    PT Home Exercise Plan  snow angel, thoracic extensions, hip IR and ER, side-stretch, side-plank    Consulted and Agree with Plan of Care  Patient       Patient will benefit from skilled therapeutic intervention in order to improve the following deficits and impairments:  Increased muscle spasms, Pain, Postural dysfunction  Visit Diagnosis: Other muscle spasm  Abnormal posture  Sacrococcygeal disorders, not elsewhere classified     Problem List Patient Active Problem List   Diagnosis Date Noted  . Low serum cortisol level ([place]) 09/09/2017  . Serum estradiol <50 pg/ml 09/09/2017   Willa Rough DPT, ATC Willa Rough 08/31/2019, 3:07 PM  [place] MAIN [facility] SERVICES [address] [place], [place], [address] Phone: [phone]   Fax:  [phone]  Name: Jessica Finley MRN: [id number] Date of Birth: [birth year]

## 2019-09-03 ENCOUNTER — Ambulatory Visit: Payer: 59

## 2019-09-10 ENCOUNTER — Ambulatory Visit: Payer: 59

## 2019-09-14 ENCOUNTER — Other Ambulatory Visit: Payer: Self-pay

## 2019-09-14 ENCOUNTER — Ambulatory Visit: Payer: 59 | Attending: Obstetrics & Gynecology

## 2019-09-14 DIAGNOSIS — M533 Sacrococcygeal disorders, not elsewhere classified: Secondary | ICD-10-CM | POA: Diagnosis not present

## 2019-09-14 DIAGNOSIS — R293 Abnormal posture: Secondary | ICD-10-CM | POA: Diagnosis not present

## 2019-09-14 DIAGNOSIS — M62838 Other muscle spasm: Secondary | ICD-10-CM | POA: Insufficient documentation

## 2019-09-14 NOTE — Patient Instructions (Signed)
Bow and arrow

## 2019-09-14 NOTE — Therapy (Signed)
[facility] MAIN [facility] SERVICES [address] [place], [place], [address] Phone: [phone]   Fax:  [phone]  Physical Therapy Treatment  The patient has been informed of current processes in place at Outpatient Rehab to protect patients from Covid-19 exposure including social distancing, schedule modifications, and new cleaning procedures. After discussing their particular risk with a therapist based on the patient's personal risk factors, the patient has decided to proceed with in-person therapy.   Patient Details  Name: Jessica Finley MRN: [id number] Date of Birth: Jul 10, 1996 Referring Provider (PT): Valere Dross   Encounter Date: 09/14/2019  PT End of Session - 09/14/19 1539    Visit Number  6    Number of Visits  10    Date for PT Re-Evaluation  10/26/19    Authorization Type  UMR    Authorization - Visit Number  1    Authorization - Number of Visits  4    PT Start Time  1230    PT Stop Time  1330    PT Time Calculation (min)  60 min    Activity Tolerance  Patient tolerated treatment well    Behavior During Therapy  [facility] for tasks assessed/performed       Past Medical History:  Diagnosis Date  . Recurrent tonsillitis 12/2017    Past Surgical History:  Procedure Laterality Date  . TONSILLECTOMY N/A 01/27/2018   Procedure: TONSILLECTOMY;  Surgeon: Serena Colonel, MD;  Location: [facility];  Service: ENT;  Laterality: N/A;  . WISDOM TOOTH EXTRACTION      There were no vitals filed for this visit.   Pelvic Floor Physical Therapy Treatment Note  SCREENING  Changes in medications, allergies, or medical history?: no    SUBJECTIVE  Patient reports: Had pain once when sitting on the couch after work. Is getting R glute/hap pain after doing leg days. Has been ~ 75% better about how she is sitting. First week felt really good then over the second week pain started coming back.   Precautions:  L L5  partial sacralization  Pain update:  Location of pain: L anterior hip Current pain:  2/10  Max pain:  6/10 Least pain:  0/10 Nature of pain: sore/ache/deep  **0/10 pain following session.  Patient Goals: Not have hip pain   OBJECTIVE  Changes in: Observation/Posture: L in-flare, PSIS, ASIS even.   Palpation: TTP to L L1 region, radiates to L hip "normal pain"  Range of Motion/Flexibilty:  Decreased R overhead reach compared to L.  Strength/MMT:  LE MMT  LE MMT Left Right  Hip flex:  (L2) /5 /5  Hip ext: 5/5 5/5  Hip abd: 5/5 5/5  Hip add: 4++/5 4++/5  Hip IR 5/5 4+++/5  Hip ER 5/5 5/5     INTERVENTIONS THIS SESSION: Manual: Performed TP release and STM L multifidus and erector spinae near L1 to to decrease spasm and pain and allow for improved balance of musculature for improved function and decreased symptoms. Performed PA mobs to L1, L2 and R sacral border to improve mobility of joint and surrounding connective tissue and decrease pressure on nerve roots for improved conductivity and function of down-stream tissues.  Dry-needle: Performed TPDN with a .30x23mm needle as described below to decrease spasm and pain and allow for improved balance of musculature for improved function and decreased symptoms.  Total time: 60 min.  Trigger Point Dry Needling - 09/14/19 0001    Consent Given?  Yes    Education Handout Provided  No    Muscles Treated Back/Hip  Erector spinae;Lumbar multifidi    Erector spinae Response  Twitch response elicited;Palpable increased muscle length    Lumbar multifidi Response  Twitch response elicited;Palpable increased muscle length             PT Short Term Goals - 08/31/19 1456      PT SHORT TERM GOAL #1   Title  Patient will demonstrate improved balance of musculature surrounding the pelvis to facilitate decreased pain.    Baseline  Spasms surrounding the pelvis    Time  4    Period   Weeks    Status  Achieved    Target Date  08/12/19      PT SHORT TERM GOAL #2   Title  Patient will demonstrate HEP x1 in the clinic to demonstrate understanding and proper form to allow for further improvement.    Baseline  Pt. lacks understanding of what therapeutic exercises will help reduce her pain.    Time  4    Period  Weeks    Status  Achieved    Target Date  08/12/19      PT SHORT TERM GOAL #3   Title  Patient will report a reduction in pain to no greater than 2/10 over the prior week to demonstrate symptom improvement.    Baseline  Max of 4/10    Time  4    Period  Weeks    Status  On-going    Target Date  09/28/19        PT Long Term Goals - 08/31/19 1457      PT LONG TERM GOAL #1   Title  Pt. will demonstrate AROM through R shoulder that is comprable to L shoulder to demonstrate improved balance and decreased restriction of fascial obility that is impacting L hip.    Baseline  ~ 10 degrees less motion in all planes with TTP surrounding the scapula and anterior chest.    Time  8    Period  Weeks    Status  On-going    Target Date  10/26/19      PT LONG TERM GOAL #2   Title  Patient will score less than or equal to 10% on the Female NIH-CPSI  and 0% on the [facility] to demonstrate a reduction in pain, urinary symptoms, and an improved quality of life.    Baseline  13/43 (30%), PDI: 1/60 (2%)    Time  8    Period  Weeks    Status  On-going    Target Date  10/26/19      PT LONG TERM GOAL #3   Title  Patient will describe no pain after sitting or laying down for>1 hr to demonstrate improved functional ability.    Baseline  Pain increased with prolonged sitting/ laying to 4/10    Time  8    Period  Weeks    Status  New    Target Date  10/26/19            Plan - 09/14/19 1544    Clinical Impression Statement  Pt. Responded well to all interventions today, demonstrating improved pelvic alignment, spinal mobility, and decreased spasm and pain, as well as  understanding and correct performance of all education and exercises provided today. They will continue to benefit from skilled physical therapy to  work toward remaining goals and maximize function as well as decrease likelihood of symptom increase or recurrence.     Personal Factors and Comorbidities  Comorbidity 1    Comorbidities  leg-length discrepancy    Examination-Activity Limitations  Other   exercise   Stability/Clinical Decision Making  Evolving/Moderate complexity    Rehab Potential  Good    PT Frequency  Biweekly    PT Duration  8 weeks    PT Treatment/Interventions  ADLs/Self Care Home Management;Moist Heat;Gait training;Therapeutic exercise;Therapeutic activities;Functional mobility training;Neuromuscular re-education;Patient/family education;Manual techniques;Passive range of motion;Dry needling;Taping;Spinal Manipulations;Joint Manipulations    PT Next Visit Plan  reassess L1 mobility/PA or manip PRN Improve mobility/decrease spasms in R shoulder, re-assess pelvic alignment.    PT Home Exercise Plan  snow angel, thoracic extensions, hip IR and ER, side-stretch, side-plank, bow-and-arrow Bilat    Consulted and Agree with Plan of Care  Patient       Patient will benefit from skilled therapeutic intervention in order to improve the following deficits and impairments:  Increased muscle spasms, Pain, Postural dysfunction  Visit Diagnosis: Other muscle spasm  Abnormal posture  Sacrococcygeal disorders, not elsewhere classified     Problem List Patient Active Problem List   Diagnosis Date Noted  . Low serum cortisol level ([place]) 09/09/2017  . Serum estradiol <50 pg/ml 09/09/2017   Willa Rough DPT, ATC Willa Rough 09/14/2019, 3:53 PM  [place] MAIN [facility] SERVICES [address] [place], [place], [address] Phone: [phone]   Fax:  [phone]  Name: Jessica Finley MRN: [id number] Date of Birth: 1996/04/03

## 2019-09-21 ENCOUNTER — Other Ambulatory Visit: Payer: Self-pay | Admitting: Family Medicine

## 2019-09-21 ENCOUNTER — Other Ambulatory Visit (HOSPITAL_COMMUNITY)
Admission: RE | Admit: 2019-09-21 | Discharge: 2019-09-21 | Disposition: A | Discharge status: Home / Self Care | Payer: 59 | Source: Ambulatory Visit | Attending: Family Medicine | Admitting: Family Medicine

## 2019-09-21 DIAGNOSIS — J3089 Other allergic rhinitis: Secondary | ICD-10-CM | POA: Diagnosis not present

## 2019-09-21 DIAGNOSIS — Z124 Encounter for screening for malignant neoplasm of cervix: Secondary | ICD-10-CM | POA: Insufficient documentation

## 2019-09-21 DIAGNOSIS — R109 Unspecified abdominal pain: Secondary | ICD-10-CM | POA: Diagnosis not present

## 2019-09-21 DIAGNOSIS — Z0000 Encounter for general adult medical examination without abnormal findings: Secondary | ICD-10-CM | POA: Diagnosis not present

## 2019-09-21 DIAGNOSIS — Z23 Encounter for immunization: Secondary | ICD-10-CM | POA: Diagnosis not present

## 2019-09-28 ENCOUNTER — Other Ambulatory Visit: Payer: Self-pay

## 2019-09-28 ENCOUNTER — Ambulatory Visit: Payer: 59

## 2019-09-28 DIAGNOSIS — R293 Abnormal posture: Secondary | ICD-10-CM | POA: Diagnosis not present

## 2019-09-28 DIAGNOSIS — M62838 Other muscle spasm: Secondary | ICD-10-CM | POA: Diagnosis not present

## 2019-09-28 DIAGNOSIS — M533 Sacrococcygeal disorders, not elsewhere classified: Secondary | ICD-10-CM

## 2019-09-28 NOTE — Therapy (Signed)
[place] MAIN [facility] SERVICES [address] [place], [place], [address] Phone: [phone]   Fax:  [phone]  Physical Therapy Treatment  The patient has been informed of current processes in place at Outpatient Rehab to protect patients from Covid-19 exposure including social distancing, schedule modifications, and new cleaning procedures. After discussing their particular risk with a therapist based on the patient's personal risk factors, the patient has decided to proceed with in-person therapy.   Patient Details  Name: Jessica Finley MRN: [id number] Date of Birth: March 12, 1996 Referring Provider (PT): Marolyn Hammock   Encounter Date: 09/28/2019  PT End of Session - 09/28/19 1609    Visit Number  7    Number of Visits  10    Date for PT Re-Evaluation  10/26/19    Authorization Type  UMR    Authorization - Visit Number  2    Authorization - Number of Visits  4    PT Start Time  1430    PT Stop Time  1545    PT Time Calculation (min)  75 min    Activity Tolerance  Patient tolerated treatment well    Behavior During Therapy  [facility] for tasks assessed/performed       Past Medical History:  Diagnosis Date  . Recurrent tonsillitis 12/2017    Past Surgical History:  Procedure Laterality Date  . TONSILLECTOMY N/A 01/27/2018   Procedure: TONSILLECTOMY;  Surgeon: Izora Gala, MD;  Location: [facility];  Service: ENT;  Laterality: N/A;  . WISDOM TOOTH EXTRACTION      There were no vitals filed for this visit.   Pelvic Floor Physical Therapy Treatment Note  SCREENING  Changes in medications, allergies, or medical history?: no    SUBJECTIVE  Patient reports: The outside of her R foot has been hurting, Is trying to walk with her R leg turned out. Triggers are the same though pain has been less.  Precautions:  L L5 partial sacralization  Pain update:  Location of pain: L anterior hip Current pain:  0/10   Max pain:  5/10 Least pain:  0/10 Nature of pain: sore/ache/deep  Patient Goals: Not have hip pain   OBJECTIVE  Changes in: Observation/Posture: L inflare/upsilp and Ranterior/L posterior rotation  Palpation: TTP to L>R adductor brevis and pectineus with referral to common place of pain in L hip.  Range of Motion/Flexibilty:  Decreased R>L thoracolumbar rotation. Decreased mobility into R segmental rotation at T12-L1   INTERVENTIONS THIS SESSION: Manual: Performed TP release to L Adductor brevis and Pectineus to decrease spasm and pain and allow for improved balance of musculature for improved function and decreased symptoms. Performed R to L rotational mob to T12 and L1 segments to improve mobility of joint and surrounding connective tissue and decrease pressure on nerve roots for improved conductivity and function of down-stream tissues. Performed mobs to R innominate in a caudal to cephalic and posterior rotational manner to improve pelvic alignment and decrease pain and spasms around the pelvis. Performed L up-slip correction to improve pelvic alignment and decrease spasms and pain. Therex: Reviewed MET correction with broom-stick and had patient re-start this exercise for 1 week to improve pelvic alignment.     Total time: 75 min.                              PT Short Term Goals - 08/31/19 1456  PT SHORT TERM GOAL #1   Title  Patient will demonstrate improved balance of musculature surrounding the pelvis to facilitate decreased pain.    Baseline  Spasms surrounding the pelvis    Time  4    Period  Weeks    Status  Achieved    Target Date  08/12/19      PT SHORT TERM GOAL #2   Title  Patient will demonstrate HEP x1 in the clinic to demonstrate understanding and proper form to allow for further improvement.    Baseline  Pt. lacks understanding of what therapeutic exercises will help reduce her pain.    Time  4    Period  Weeks    Status   Achieved    Target Date  08/12/19      PT SHORT TERM GOAL #3   Title  Patient will report a reduction in pain to no greater than 2/10 over the prior week to demonstrate symptom improvement.    Baseline  Max of 4/10    Time  4    Period  Weeks    Status  On-going    Target Date  09/28/19        PT Long Term Goals - 08/31/19 1457      PT LONG TERM GOAL #1   Title  Pt. will demonstrate AROM through R shoulder that is comprable to L shoulder to demonstrate improved balance and decreased restriction of fascial obility that is impacting L hip.    Baseline  ~ 10 degrees less motion in all planes with TTP surrounding the scapula and anterior chest.    Time  8    Period  Weeks    Status  On-going    Target Date  10/26/19      PT LONG TERM GOAL #2   Title  Patient will score less than or equal to 10% on the Female NIH-CPSI  and 0% on the [facility] to demonstrate a reduction in pain, urinary symptoms, and an improved quality of life.    Baseline  13/43 (30%), PDI: 1/60 (2%)    Time  8    Period  Weeks    Status  On-going    Target Date  10/26/19      PT LONG TERM GOAL #3   Title  Patient will describe no pain after sitting or laying down for>1 hr to demonstrate improved functional ability.    Baseline  Pain increased with prolonged sitting/ laying to 4/10    Time  8    Period  Weeks    Status  New    Target Date  10/26/19            Plan - 09/28/19 1609    Clinical Impression Statement  Pt. Responded well to all interventions today, demonstrating improved pelvic alignment by ~ 50% following treatment and decreased spasm and familiar pain as well as understanding and correct performance of all education and exercises provided today. They will continue to benefit from skilled physical therapy to work toward remaining goals and maximize function as well as decrease likelihood of symptom increase or recurrence.     Personal Factors and Comorbidities  Comorbidity 1    Comorbidities   leg-length discrepancy    Examination-Activity Limitations  Other   exercise   Stability/Clinical Decision Making  Evolving/Moderate complexity    Rehab Potential  Good    PT Frequency  Biweekly    PT Duration  8 weeks  PT Treatment/Interventions  ADLs/Self Care Home Management;Moist Heat;Gait training;Therapeutic exercise;Therapeutic activities;Functional mobility training;Neuromuscular re-education;Patient/family education;Manual techniques;Passive range of motion;Dry needling;Taping;Spinal Manipulations;Joint Manipulations    PT Next Visit Plan  reassess L1 mobility and pelvic alignment assess/address mobility/decrease spasms in R shoulder,    PT Home Exercise Plan  snow angel, thoracic extensions, hip IR and ER, side-stretch, side-plank, bow-and-arrow Bilat MET for R anterior rotaton.    Consulted and Agree with Plan of Care  Patient       Patient will benefit from skilled therapeutic intervention in order to improve the following deficits and impairments:  Increased muscle spasms, Pain, Postural dysfunction  Visit Diagnosis: Other muscle spasm  Abnormal posture  Sacrococcygeal disorders, not elsewhere classified     Problem List Patient Active Problem List   Diagnosis Date Noted  . Low serum cortisol level ([place]) 09/09/2017  . Serum estradiol <50 pg/ml 09/09/2017   Willa Rough DPT, ATC Willa Rough 09/28/2019, 4:19 PM  [place] MAIN [facility] SERVICES [address] [place], [place], [address] Phone: [phone]   Fax:  [phone]  Name: Chela Sutphen MRN: [id number] Date of Birth: February 23, 1996

## 2019-09-28 NOTE — Patient Instructions (Signed)
Do MET correction with broomstick for 1 week

## 2019-09-30 LAB — CYTOLOGY - PAP: Diagnosis: NEGATIVE

## 2019-10-06 DIAGNOSIS — F438 Other reactions to severe stress: Secondary | ICD-10-CM | POA: Diagnosis not present

## 2019-10-08 ENCOUNTER — Other Ambulatory Visit: Payer: Self-pay

## 2019-10-08 ENCOUNTER — Ambulatory Visit: Payer: 59 | Attending: Obstetrics & Gynecology

## 2019-10-08 DIAGNOSIS — M62838 Other muscle spasm: Secondary | ICD-10-CM | POA: Insufficient documentation

## 2019-10-08 DIAGNOSIS — M533 Sacrococcygeal disorders, not elsewhere classified: Secondary | ICD-10-CM | POA: Diagnosis not present

## 2019-10-08 DIAGNOSIS — R293 Abnormal posture: Secondary | ICD-10-CM | POA: Diagnosis not present

## 2019-10-08 DIAGNOSIS — M7918 Myalgia, other site: Secondary | ICD-10-CM | POA: Insufficient documentation

## 2019-10-08 NOTE — Therapy (Signed)
[place] MAIN [facility] SERVICES [address] [place], [place], [address] Phone: [phone]   Fax:  [phone]  Physical Therapy Treatment  The patient has been informed of current processes in place at Outpatient Rehab to protect patients from Covid-19 exposure including social distancing, schedule modifications, and new cleaning procedures. After discussing their particular risk with a therapist based on the patient's personal risk factors, the patient has decided to proceed with in-person therapy.   Patient Details  Name: Jessica Finley MRN: [id number] Date of Birth: [birth year] Referring Provider (PT): Marolyn Hammock   Encounter Date: 10/08/2019  PT End of Session - 10/08/19 1508    Visit Number  8    Number of Visits  10    Date for PT Re-Evaluation  10/26/19    Authorization Type  UMR    Authorization - Visit Number  3    Authorization - Number of Visits  4    PT Start Time  1406    PT Stop Time  1511    PT Time Calculation (min)  65 min    Activity Tolerance  Patient tolerated treatment well    Behavior During Therapy  [facility] for tasks assessed/performed       Past Medical History:  Diagnosis Date  . Recurrent tonsillitis 12/2017    Past Surgical History:  Procedure Laterality Date  . TONSILLECTOMY N/A 01/27/2018   Procedure: TONSILLECTOMY;  Surgeon: Izora Gala, MD;  Location: [facility];  Service: ENT;  Laterality: N/A;  . WISDOM TOOTH EXTRACTION      There were no vitals filed for this visit.   Pelvic Floor Physical Therapy Treatment Note  SCREENING  Changes in medications, allergies, or medical history?: no    SUBJECTIVE  Patient reports: The outside of her R foot is still sore. Triggers are the same though pain has been less. 3 days since last visit and no glute/hamstring pain but has not done any deal-lifts.   Precautions:  L L5 partial sacralization  Pain update:  Location of pain: L  anterior hip Current pain:  0/10  Max pain:  5/10 Least pain:  0/10 Nature of pain: sore/ache/deep  Patient Goals: Not have hip pain   OBJECTIVE  Changes in: Observation/Posture: Noted- improved pelvic alignment at baseline and further improvement post session.   Palpation: TTP to R>L adductor brevis and pectineus with referral to common place of pain in L hip and low back.  Range of Motion/Flexibilty:     INTERVENTIONS THIS SESSION: Manual: Performed TP release to R Adductor brevis and Pectineus to decrease spasm and pain and allow for improved balance of musculature for improved function and decreased symptoms.  --Performed R shoulder pec minor and subscapularis TP release to decrease spasm and pain and allow for improved balance of musculature for improved function and decreased symptoms. -- Performed B suboccipital/ levator scapularis TP release, with gentle cervical distraction to decrease posterior fascial line in order to decrease migraine symptomology.  -- Performed first ray mobs to the R foot, grades III mobs at talocrural joint, eliciting posterior chain tightness. Cuneiform mobilization grade II/III on R all to decrease plantar fascia to decrease R LE pain.   Therex: Education for walking through first ray in bilateral LE.     Total time: 65 min.                            PT Short Term  Goals - 08/31/19 1456      PT SHORT TERM GOAL #1   Title  Patient will demonstrate improved balance of musculature surrounding the pelvis to facilitate decreased pain.    Baseline  Spasms surrounding the pelvis    Time  4    Period  Weeks    Status  Achieved    Target Date  08/12/19      PT SHORT TERM GOAL #2   Title  Patient will demonstrate HEP x1 in the clinic to demonstrate understanding and proper form to allow for further improvement.    Baseline  Pt. lacks understanding of what therapeutic exercises will help reduce her pain.    Time  4     Period  Weeks    Status  Achieved    Target Date  08/12/19      PT SHORT TERM GOAL #3   Title  Patient will report a reduction in pain to no greater than 2/10 over the prior week to demonstrate symptom improvement.    Baseline  Max of 4/10    Time  4    Period  Weeks    Status  On-going    Target Date  09/28/19        PT Long Term Goals - 08/31/19 1457      PT LONG TERM GOAL #1   Title  Pt. will demonstrate AROM through R shoulder that is comprable to L shoulder to demonstrate improved balance and decreased restriction of fascial obility that is impacting L hip.    Baseline  ~ 10 degrees less motion in all planes with TTP surrounding the scapula and anterior chest.    Time  8    Period  Weeks    Status  On-going    Target Date  10/26/19      PT LONG TERM GOAL #2   Title  Patient will score less than or equal to 10% on the Female NIH-CPSI  and 0% on the [facility] to demonstrate a reduction in pain, urinary symptoms, and an improved quality of life.    Baseline  13/43 (30%), PDI: 1/60 (2%)    Time  8    Period  Weeks    Status  On-going    Target Date  10/26/19      PT LONG TERM GOAL #3   Title  Patient will describe no pain after sitting or laying down for>1 hr to demonstrate improved functional ability.    Baseline  Pain increased with prolonged sitting/ laying to 4/10    Time  8    Period  Weeks    Status  New    Target Date  10/26/19            Plan - 10/08/19 1509    Clinical Impression Statement  Pt. Responded well to all interventions today, demonstrating decreased pain levels in R shoulder, hip and foot, pt demonstrated improved pelvic alignment at baseline from HEP performance, further improvement following treatment as well as understanding and correct performance of all education and exercises provided today. They will continue to benefit from skilled physical therapy to work toward remaining goals and maximize function as well as decrease likelihood of symptom  increase or recurrence.    Personal Factors and Comorbidities  Comorbidity 1    Comorbidities  leg-length discrepancy    Examination-Activity Limitations  Other   exercise   Stability/Clinical Decision Making  Evolving/Moderate complexity    Rehab Potential  Good  PT Frequency  Biweekly    PT Duration  8 weeks    PT Treatment/Interventions  ADLs/Self Care Home Management;Moist Heat;Gait training;Therapeutic exercise;Therapeutic activities;Functional mobility training;Neuromuscular re-education;Patient/family education;Manual techniques;Passive range of motion;Dry needling;Taping;Spinal Manipulations;Joint Manipulations    PT Next Visit Plan  reassess L1 mobility and pelvic alignment assess/address mobility/decrease spasms in R shoulder,    PT Home Exercise Plan  snow angel, thoracic extensions, hip IR and ER, side-stretch, side-plank, bow-and-arrow Bilat MET for R anterior rotaton.    Consulted and Agree with Plan of Care  Patient       Patient will benefit from skilled therapeutic intervention in order to improve the following deficits and impairments:  Increased muscle spasms, Pain, Postural dysfunction  Visit Diagnosis: Other muscle spasm  Abnormal posture  Sacrococcygeal disorders, not elsewhere classified  Myalgia of pelvic floor     Problem List Patient Active Problem List   Diagnosis Date Noted  . Low serum cortisol level ([place]) 09/09/2017  . Serum estradiol <50 pg/ml 09/09/2017   Willa Rough DPT, ATC Willa Rough 10/08/2019, 5:32 PM  [place] MAIN [facility] SERVICES [address] [place], [place], [address] Phone: [phone]   Fax:  [phone]  Name: Jessica Finley MRN: [id number] Date of Birth: [birth year]

## 2019-10-22 ENCOUNTER — Ambulatory Visit: Payer: 59

## 2019-10-22 ENCOUNTER — Other Ambulatory Visit: Payer: Self-pay

## 2019-10-22 DIAGNOSIS — M7918 Myalgia, other site: Secondary | ICD-10-CM | POA: Diagnosis not present

## 2019-10-22 DIAGNOSIS — M62838 Other muscle spasm: Secondary | ICD-10-CM

## 2019-10-22 DIAGNOSIS — M533 Sacrococcygeal disorders, not elsewhere classified: Secondary | ICD-10-CM

## 2019-10-22 DIAGNOSIS — R293 Abnormal posture: Secondary | ICD-10-CM | POA: Diagnosis not present

## 2019-10-22 NOTE — Patient Instructions (Signed)
  Single leg- bridge perform for L side only  2 sets of 10 (as long as performance stays equal) progress to 2 sets of 15 if 10 is too easy.    Roman Dead-lift For Ankle Stability  - With one 5lb kettle-bell- go into single leg dead-lift position, watch for equal pelvic alignment; then rotation kettle bell around stabilized leg for 5 clock wise and 5 counter clockwise motions.  -repeat on the other leg  - 2 sets of 10     Multifidi Stabilization  -2 sets of 10   -with feet shoulder width apart, and thera-band stabilized with door, engage core, then slowly rotate the shoulders (without moving the hips) on the exhale.  -inhale then return to center    Rhomboid Stabilization  For 2 sets of 10 with green theraband  - Set prior to each rep  - Bring shoulder blades down and back then keep arms out straight- in exhalation bring arms out to the side.

## 2019-10-22 NOTE — Therapy (Addendum)
[facility] [address] [place], [place], [address] Phone: [phone]   Fax:  [phone]  Physical Therapy Treatment The patient has been informed of current processes in place at Outpatient Rehab to protect patients from Covid-19 exposure including social distancing, schedule modifications, and new cleaning procedures. After discussing their particular risk with a therapist based on the patient's personal risk factors, the patient has decided to proceed with in-person therapy.  Patient Details  Name: Jessica RussellSara Finley MRN: [id number] Date of Birth: 09/17/1996 Referring Provider (PT): Valere DrossErica Fletcher Robinson   Encounter Date: 10/22/2019  PT End of Session - 10/22/19 1504    Visit Number  9    Number of Visits  10    Date for PT Re-Evaluation  10/26/19    Authorization Type  [facility]    Authorization - Visit Number  4    Authorization - Number of Visits  4    PT Start Time  1400    PT Stop Time  1500    PT Time Calculation (min)  60 min    Activity Tolerance  Patient tolerated treatment well    Behavior During Therapy  [facility] for tasks assessed/performed       Past Medical History:  Diagnosis Date  . Recurrent tonsillitis 12/2017    Past Surgical History:  Procedure Laterality Date  . TONSILLECTOMY N/A 01/27/2018   Procedure: TONSILLECTOMY;  Surgeon: Serena Colonelosen, Jefry, MD;  Location: [facility];  Service: ENT;  Laterality: N/A;  . WISDOM TOOTH EXTRACTION      There were no vitals filed for this visit.    Pelvic Floor Physical Therapy Treatment Note  SCREENING  Changes in medications, allergies, or medical history?: no    SUBJECTIVE  Patient reports: R foot is still sore- less sore from last time. Decreased tenderness in L hip- no tenderness in glute/hamstring. Less weight dead-lifts. Feels like L glute/hamstring is not firing as fast as R. Has been more aware of posture- feels like R side.     Precautions:  L L5 partial sacralization  Pain update:  Location of pain: L anterior hip Current pain:  0/10  Max pain:  4/10 Least pain:  0/10 Nature of pain: sore/ache/deep  Patient Goals: Not have hip pain   OBJECTIVE  Changes in: Observation/Posture: Noted- improved pelvic alignment at baseline and further improvement post session.  L Up-slip and in-flare in supine, not as evident in standing  --(50% of up-slip correction and resolution of in-flare post session)   Palpation: TTP R pectineus, L QL   Range of Motion/Flexibilty:     INTERVENTIONS THIS SESSION: Manual: Performed TP release to L QL and R pectineus to decrease spasm and pain and allow for improved balance of musculature for improved function and decreased symptoms.  --T2-12 grade II/III PA mobs with thoracic rotation (MWM) in order to release tension possibly contributing to decreased stability at B scapulae.  -- TP release at R levator scapulae (to reduce shoulder elevation with rhomboid activation), B manual cervical traction, B posterior cervical spinal musculature manual tension release to decrease spasm and pain and allow for improved balance of musculature for improved function and decreased symptoms (migraines). (30 minutes)   ThereEx: Education on single leg [place] dead-lifts with ankle stabilization for further posterior chain stability. Performance of multifidi standing stabilization (HEP 2 sets of 10, bilaterally). Cues required for attaining pelvic neutral and reducing hip rotation. Performance of rhomboid stabilizations exercise with green t-band (shoulder  abduction with scapular retraction and depression) in order to address shoulder instability deficits still remaining. Cues required for decreasing R shoulder elevation. Performance of single leg bridges (L stabilization) in order to facilitate equal coordination of LE posterior chain musculature. Educated on the structure and function of the  remaining deficits found today in relation to their symptoms as well as the plan of care, and new HEP in order to set patient expectations and understanding from which we will build on in the future sessions.  (30 minutes)            Total time: 60 min.                          PT Short Term Goals - 10/22/19 1525      PT SHORT TERM GOAL #1   Title  Patient will demonstrate improved balance of musculature surrounding the pelvis to facilitate decreased pain.    Baseline  Spasms surrounding the pelvis. 10-22 Decreased TTP located surrounding pelvis (pectineus and QL still present)    Time  4    Period  Weeks    Status  Achieved    Target Date  08/12/19      PT SHORT TERM GOAL #2   Title  Patient will demonstrate HEP x1 in the clinic to demonstrate understanding and proper form to allow for further improvement.    Baseline  Pt. lacks understanding of what therapeutic exercises will help reduce her pain.    Time  4    Period  Weeks    Status  Achieved    Target Date  08/12/19      PT SHORT TERM GOAL #3   Title  Patient will report a reduction in pain to no greater than 2/10 over the prior week to demonstrate symptom improvement.    Baseline  Max of 4/10; 10-22 max of 4/10 in L hip - decreased frequency    Time  4    Period  Weeks    Status  On-going    Target Date  11/19/19        PT Long Term Goals - 10/22/19 1527      PT LONG TERM GOAL #1   Title  Pt. will demonstrate AROM through R shoulder that is comprable to L shoulder to demonstrate improved balance and decreased restriction of fascial obility that is impacting L hip.    Baseline  ~ 10 degrees less motion in all planes with TTP surrounding the scapula and anterior chest; 10-22 Subjective increase in scapular mobility following thoracolumbar mobility and rhomboid stability training.    Time  8    Period  Weeks    Status  On-going    Target Date  12/17/19      PT LONG TERM GOAL #2   Title   Patient will score less than or equal to 10% on the Female NIH-CPSI  and 0% on the [facility] to demonstrate a reduction in pain, urinary symptoms, and an improved quality of life.    Baseline  13/43 (30%), [place]: 1/60 (2%) 10-22-19 Unable to assess    Time  8    Period  Weeks    Status  On-going    Target Date  12/17/19      PT LONG TERM GOAL #3   Title  Patient will describe no pain after sitting or laying down for>1 hr to demonstrate improved functional ability.    Baseline  Pain  increased with prolonged sitting/ laying to 4/10; 10-22-19 subjectively patient has described increased abiliity to participate in functional mobility with pain decreasing.    Time  8    Period  Weeks    Status  On-going    Target Date  12/17/19            Plan - 10/22/19 1435    Clinical Impression Statement  Pt. Responded well to all interventions today, demonstrating improved pelvic alignment, improved scapular movement pattern, decreased musculaure spasms, as well as understanding and correct performance of all education and exercises provided today.   Patient has demonstrated improvement in her pain management strategies, she describes less over all pain in her L hip and R foot. She describes she still feels imbalanced in her strength and she still has concerns for reducing future injuries.   Patient's condition has the potential to improve in response to therapy. Maximum improvement is yet to be obtained. The anticipated improvement is attainable and reasonable in a generally predictable time.       Personal Factors and Comorbidities  Comorbidity 1    Comorbidities  leg-length discrepancy    Examination-Activity Limitations  Other   exercise   Stability/Clinical Decision Making  Evolving/Moderate complexity    Rehab Potential  Good    PT Frequency  Biweekly    PT Duration  8 weeks    PT Treatment/Interventions  ADLs/Self Care Home Management;Moist Heat;Gait training;Therapeutic exercise;Therapeutic  activities;Functional mobility training;Neuromuscular re-education;Patient/family education;Manual techniques;Passive range of motion;Dry needling;Taping;Spinal Manipulations;Joint Manipulations    PT Next Visit Plan  reassess L1 mobility and pelvic alignment assess/address mobility/decrease spasms in R shoulder,    PT Home Exercise Plan  snow angel, thoracic extensions, side-stretch, side-plank, bow-and-arrow Bilat, horizontal ABD of shoulders with band,    Consulted and Agree with Plan of Care  Patient       Patient will benefit from skilled therapeutic intervention in order to improve the following deficits and impairments:  Increased muscle spasms, Pain, Postural dysfunction  Visit Diagnosis: Other muscle spasm  Abnormal posture  Sacrococcygeal disorders, not elsewhere classified     Problem List Patient Active Problem List   Diagnosis Date Noted  . Low serum cortisol level (HCC) 09/09/2017  . Serum estradiol <50 pg/ml 09/09/2017    Zakyah Yanes, SPT  10/23/2019, 11:54 AM   This entire session was performed under direct supervision and direction of a licensed therapist/therapist assistant . I have personally read, edited and approve of the note as written.  Cleophus Molt DPT, ATC   [facility] MAIN [facility] SERVICES [address] [place], [place], [address] Phone: [phone]   Fax:  [phone]  Name: Marialice Newkirk MRN: [id number] Date of Birth: 28-Jan-1996

## 2019-10-23 NOTE — Addendum Note (Signed)
Addended by: Letitia Libra T on: 10/23/2019 12:01 PM   Modules accepted: Orders

## 2019-11-03 ENCOUNTER — Ambulatory Visit: Payer: 59 | Attending: Obstetrics & Gynecology

## 2019-11-03 ENCOUNTER — Other Ambulatory Visit: Payer: Self-pay

## 2019-11-03 DIAGNOSIS — R293 Abnormal posture: Secondary | ICD-10-CM | POA: Insufficient documentation

## 2019-11-03 DIAGNOSIS — M7918 Myalgia, other site: Secondary | ICD-10-CM | POA: Diagnosis not present

## 2019-11-03 DIAGNOSIS — M62838 Other muscle spasm: Secondary | ICD-10-CM | POA: Diagnosis not present

## 2019-11-03 DIAGNOSIS — M533 Sacrococcygeal disorders, not elsewhere classified: Secondary | ICD-10-CM | POA: Insufficient documentation

## 2019-11-03 NOTE — Therapy (Signed)
[facility] MAIN [facility] SERVICES [address] [place], [place], [address] Phone: [phone]   Fax:  [phone]  Physical Therapy Treatment The patient has been informed of current processes in place at Outpatient Rehab to protect patients from Covid-19 exposure including social distancing, schedule modifications, and new cleaning procedures. After discussing their particular risk with a therapist based on the patient's personal risk factors, the patient has decided to proceed with in-person therapy.  Patient Details  Name: Jessica Finley MRN: [id number] Date of Birth: June 14, 1996 Referring Provider (PT): Valere Dross   Encounter Date: 11/03/2019  PT End of Session - 11/03/19 1510    Visit Number  10    Number of Visits  14    Date for PT Re-Evaluation  10/26/19    Authorization Type  [facility]R    Authorization - Visit Number  5    Authorization - Number of Visits  8    PT Start Time  1435    PT Stop Time  1535    PT Time Calculation (min)  60 min    Activity Tolerance  Patient tolerated treatment well    Behavior During Therapy  [facility] for tasks assessed/performed       Past Medical History:  Diagnosis Date  . Recurrent tonsillitis 12/2017    Past Surgical History:  Procedure Laterality Date  . TONSILLECTOMY N/A 01/27/2018   Procedure: TONSILLECTOMY;  Surgeon: Serena Colonel, MD;  Location: [facility];  Service: ENT;  Laterality: N/A;  . WISDOM TOOTH EXTRACTION      There were no vitals filed for this visit.    Pelvic Floor Physical Therapy Treatment Note  SCREENING  Changes in medications, allergies, or medical history?: no    SUBJECTIVE  Patient reports: L hip pain occurred twice in the last 2 weeks- got up to 6/10 on NPS. Hip flexor stretch doesn't make it feel better. R foot still doesn't feel great. Working out has been fine. Conscious of firing glutes at the same time. Feels like L is IR'd Rhomboid  stability is great. Reports toe clenching at rest.   Precautions:  L L5 partial sacralization  Pain update:  Location of pain: L anterior hip Current pain:  0/10  Max pain:  6/10 -lasted for 5 hours.  Least pain:  0/10 Nature of pain: sore/ache/deep -- stayed 0/10 on NPS following session- no eliciting of L hip pain   Patient Goals: Not have hip pain   OBJECTIVE  Changes in: Observation/Posture: R foot inversion in prone   Palpation: TTP R peroneals, IT band fascia  Hypomobility at T/L junction and gross SIJ  TTP L psoas   Range of Motion/Flexibilty:     INTERVENTIONS THIS SESSION: Manual: Performed TP release and STM to R peroneals and proximal lateral achilles muscle belly to decrease lateral R knee and intrinsic foot pain. Myofascial release along R IT band/TFL to decreased tension along anterior chain to decrease spasm and pain and allow for improved balance of musculature for improved function and decreased symptoms. (35 minutes)   Dry-needle: Performed TPDN with .30x23mm needle as described below in prone to decrease spasm and pain and allow for improved balance of musculature for improved function and decreased symptoms.  Therex: Tall kneeling to half kneeling position with glute firing sequencing for increased glute strength and increased sacral mobility. Cues for decreased compensation of lateral lean, delayed postural reaction on L > R. Half kneeling hip flexor stretch, bilaterally with thoracic rotation  for improved T/L junction mobility. No flare up of pain following stretch. Education on decreasing arch tension in B feet R>L, with mindfulness practice - decreasing toe tension when practicing diaphragmatic breathing. Education on ankle to knee to hip alignment while working out. (20 minutes)       Total time: 60 min.                               PT Short Term Goals - 10/22/19 1525      PT SHORT TERM GOAL #1   Title  Patient will  demonstrate improved balance of musculature surrounding the pelvis to facilitate decreased pain.    Baseline  Spasms surrounding the pelvis. 10-22 Decreased TTP located surrounding pelvis (pectineus and QL still present)    Time  4    Period  Weeks    Status  Achieved    Target Date  08/12/19      PT SHORT TERM GOAL #2   Title  Patient will demonstrate HEP x1 in the clinic to demonstrate understanding and proper form to allow for further improvement.    Baseline  Pt. lacks understanding of what therapeutic exercises will help reduce her pain.    Time  4    Period  Weeks    Status  Achieved    Target Date  08/12/19      PT SHORT TERM GOAL #3   Title  Patient will report a reduction in pain to no greater than 2/10 over the prior week to demonstrate symptom improvement.    Baseline  Max of 4/10; 10-22 max of 4/10 in L hip - decreased frequency    Time  4    Period  Weeks    Status  On-going    Target Date  11/19/19        PT Long Term Goals - 10/22/19 1527      PT LONG TERM GOAL #1   Title  Pt. will demonstrate AROM through R shoulder that is comprable to L shoulder to demonstrate improved balance and decreased restriction of fascial obility that is impacting L hip.    Baseline  ~ 10 degrees less motion in all planes with TTP surrounding the scapula and anterior chest; 10-22 Subjective increase in scapular mobility following thoracolumbar mobility and rhomboid stability training.    Time  8    Period  Weeks    Status  On-going    Target Date  12/17/19      PT LONG TERM GOAL #2   Title  Patient will score less than or equal to 10% on the Female NIH-CPSI  and 0% on the [facility] to demonstrate a reduction in pain, urinary symptoms, and an improved quality of life.    Baseline  13/43 (30%), [place]: 1/60 (2%) 10-22-19 Unable to assess    Time  8    Period  Weeks    Status  On-going    Target Date  12/17/19      PT LONG TERM GOAL #3   Title  Patient will describe no pain after sitting or  laying down for>1 hr to demonstrate improved functional ability.    Baseline  Pain increased with prolonged sitting/ laying to 4/10; 10-22-19 subjectively patient has described increased abiliity to participate in functional mobility with pain decreasing.    Time  8    Period  Weeks    Status  On-going  Target Date  12/17/19            Plan - 11/03/19 1650    Clinical Impression Statement  Pt. Responded well to all interventions today, demonstrating decreased spasm and R knee pain, improved B gluteal and calf coordination as well as understanding and correct performance of all education and exercises provided today. They will continue to benefit from skilled physical therapy to work toward remaining goals and maximize function as well as decrease likelihood of symptom increase or recurrence.    Personal Factors and Comorbidities  Comorbidity 1    Comorbidities  leg-length discrepancy    Examination-Activity Limitations  Other   exercise   Stability/Clinical Decision Making  Evolving/Moderate complexity    Rehab Potential  Good    PT Frequency  Biweekly    PT Duration  8 weeks    PT Treatment/Interventions  ADLs/Self Care Home Management;Moist Heat;Gait training;Therapeutic exercise;Therapeutic activities;Functional mobility training;Neuromuscular re-education;Patient/family education;Manual techniques;Passive range of motion;Dry needling;Taping;Spinal Manipulations;Joint Manipulations    PT Next Visit Plan  reassess L1 mobility and pelvic alignment assess/address mobility/decrease spasms in R shoulder; look at sacral mobility and relationship to pain    PT Home Exercise Plan  snow angel, thoracic extensions, side-stretch, side-plank, bow-and-arrow Bilat, horizontal ABD of shoulders with band, tall kneeling to half kneel; intrinsic foot musculature relaxation; half kneel thoracic rotation and hip flexor stretch    Consulted and Agree with Plan of Care  Patient       Patient will  benefit from skilled therapeutic intervention in order to improve the following deficits and impairments:  Increased muscle spasms, Pain, Postural dysfunction  Visit Diagnosis: Other muscle spasm  Abnormal posture  Sacrococcygeal disorders, not elsewhere classified  Myalgia of pelvic floor     Problem List Patient Active Problem List   Diagnosis Date Noted  . Low serum cortisol level (HCC) 09/09/2017  . Serum estradiol <50 pg/ml 09/09/2017   Jessica Finley, Jessica Finley  Jessica Finley 11/04/2019, 9:23 AM  [place] [facility] [address] [place], [place], [address] Phone: [phone]   Fax:  [phone]  Name: Tyron RussellSara Shone MRN: [id number] Date of Birth: 09/09/1996

## 2019-11-03 NOTE — Patient Instructions (Addendum)
Hip Flexor Stretch: Proposal Pose    Maintain pelvic tuck under, lift pubic bone toward navel. Engage posterior hip muscles (firm glute muscles of leg in back position) and shift forward until you feel stretch on front of leg that is down. To increase stretch, maintain balance and ease hips forward. You may use one hand on a chair for balance if needed. Hold for __5__ breaths. Repeat __2-3__ times each leg.  Do _1-2__ times per day.   --with our without a medicine ball (abdominal stability) turn your shoulders so they are facing the wall behind the elevated knee. Repeat on the other side. Repeat more reps to the left than the right.       Start in tall kneeling or when both knees are on the ground, and pelvis is tucked under.  Make sure the hips are both pointed forward and that the knee is not forward past the toes. Activate and draw up the pelvic floor and tuck pelvis slightly under by squeezing the lower tummy muscles and glutes. Drawing one leg forward like the photo shown. Hold up to 10 seconds, resting when the glutes becomes tired. Repeat _10x2_ times, then repeat on the other side,___2x_ times per day. - Repeat with right leg moving more than left for left glutes have to stabilize more often.    Intrinsic Foot musculature- perform with tennis ball or frozen water bottle.    Watch to make sure both feet are facing forward, in the same line as your knees.   Become aware if toes are curled during exercise or walking. See if you can relax the toes as you continue to workout or in periods of stress.    Bring heels together x10 reps. Minimal movement. Called Dortheys.

## 2019-11-17 ENCOUNTER — Ambulatory Visit: Payer: 59

## 2019-11-17 ENCOUNTER — Other Ambulatory Visit: Payer: Self-pay

## 2019-11-17 DIAGNOSIS — M7918 Myalgia, other site: Secondary | ICD-10-CM | POA: Diagnosis not present

## 2019-11-17 DIAGNOSIS — M62838 Other muscle spasm: Secondary | ICD-10-CM | POA: Diagnosis not present

## 2019-11-17 DIAGNOSIS — R293 Abnormal posture: Secondary | ICD-10-CM

## 2019-11-17 DIAGNOSIS — M533 Sacrococcygeal disorders, not elsewhere classified: Secondary | ICD-10-CM | POA: Diagnosis not present

## 2019-11-17 NOTE — Therapy (Signed)
[facility] MAIN [facility] SERVICES [address] [place], [place], [address] Phone: [phone]   Fax:  [phone]  Physical Therapy Treatment The patient has been informed of current processes in place at Outpatient Rehab to protect patients from Covid-19 exposure including social distancing, schedule modifications, and new cleaning procedures. After discussing their particular risk with a therapist based on the patient's personal risk factors, the patient has decided to proceed with in-person therapy.  Patient Details  Name: Jessica Finley MRN: [id number] Date of Birth: 10/26/1996 Referring Provider (PT): Valere Dross   Encounter Date: 11/17/2019  PT End of Session - 11/17/19 1649    Visit Number  11    Number of Visits  14    Date for PT Re-Evaluation  10/26/19    Authorization Type  [facility]    Authorization - Visit Number  6    Authorization - Number of Visits  8    PT Start Time  1530    PT Stop Time  1630    PT Time Calculation (min)  60 min    Activity Tolerance  Patient tolerated treatment well    Behavior During Therapy  [facility] for tasks assessed/performed       Past Medical History:  Diagnosis Date  . Recurrent tonsillitis 12/2017    Past Surgical History:  Procedure Laterality Date  . TONSILLECTOMY N/A 01/27/2018   Procedure: TONSILLECTOMY;  Surgeon: Serena Colonel, MD;  Location: [facility];  Service: ENT;  Laterality: N/A;  . WISDOM TOOTH EXTRACTION      There were no vitals filed for this visit.  Pelvic Floor Physical Therapy Treatment Note  SCREENING  Changes in medications, allergies, or medical history?: no    SUBJECTIVE  Patient reports: R gluteal/ hamstrings achey- irritating the R hip after walking (5 occurences); L hip pain- next day after PT occurred - no other incidents. Foot pain has almost resolved. Pt feels like she is actually walking more correctly. Hip flexor tension/ quad tension/  laterally with half kneeling exercise. Lower body exercise was more painful this week. Still feels imbalanced when doing a dead-lift.   Precautions:  L L5 partial sacralization  Pain update:  Location of pain: L anterior hip; R hamstrings/groin pain  Current pain:  0/10  Max pain:  NA 10 Least pain:  0/10 Nature of pain: ache - not pain  -- stayed 0/10 on NPS following session  Patient Goals: Not have hip pain   OBJECTIVE  Changes in: Observation/Posture: R foot inversion in prone - decreased this session   Palpation: TTP R hamstrings insertion  Myofascial tension along R TFL  TTP R psoas  TTP and myofascial along R sartorius   Range of Motion/Flexibilty:  Prone extension decreased on R v L -- improved post cupping technique    INTERVENTIONS THIS SESSION: Manual: Manual TP release at hamstring insertion on R, and R psoas to decrease spasm and pain and allow for improved balance of musculature for improved function and decreased symptoms. Myofascial release (including with cupping) along R IT band/TFL and sartorius to decreased tension along anterior chain to decrease spasm and pain and allow for improved balance of musculature for improved function and decreased symptoms. Prone flexed coccyx correction with nutation mobs at base of sacrum and PA mobs at SIJ mobs- relief L hip tension and resolution of coccyx pain. (45 minutes)   Therex:  Trial of hamstring stretches in supine - added hamstring strap stretch with hip  ADD to HEP due to elicitation of stretch near trigger points addressed this session. Half kneel hip flexor stretch with rotation performed following hip flexor manual treatment- via pt report did not decrease tension along anterior thigh. (15 minutes)     Total time: 60 min.      PT Short Term Goals - 10/22/19 1525      PT SHORT TERM GOAL #1   Title  Patient will demonstrate improved balance of musculature surrounding the pelvis to facilitate decreased  pain.    Baseline  Spasms surrounding the pelvis. 10-22 Decreased TTP located surrounding pelvis (pectineus and QL still present)    Time  4    Period  Weeks    Status  Achieved    Target Date  08/12/19      PT SHORT TERM GOAL #2   Title  Patient will demonstrate HEP x1 in the clinic to demonstrate understanding and proper form to allow for further improvement.    Baseline  Pt. lacks understanding of what therapeutic exercises will help reduce her pain.    Time  4    Period  Weeks    Status  Achieved    Target Date  08/12/19      PT SHORT TERM GOAL #3   Title  Patient will report a reduction in pain to no greater than 2/10 over the prior week to demonstrate symptom improvement.    Baseline  Max of 4/10; 10-22 max of 4/10 in L hip - decreased frequency    Time  4    Period  Weeks    Status  On-going    Target Date  11/19/19        PT Long Term Goals - 10/22/19 1527      PT LONG TERM GOAL #1   Title  Pt. will demonstrate AROM through R shoulder that is comprable to L shoulder to demonstrate improved balance and decreased restriction of fascial obility that is impacting L hip.    Baseline  ~ 10 degrees less motion in all planes with TTP surrounding the scapula and anterior chest; 10-22 Subjective increase in scapular mobility following thoracolumbar mobility and rhomboid stability training.    Time  8    Period  Weeks    Status  On-going    Target Date  12/17/19      PT LONG TERM GOAL #2   Title  Patient will score less than or equal to 10% on the Female NIH-CPSI  and 0% on the [facility] to demonstrate a reduction in pain, urinary symptoms, and an improved quality of life.    Baseline  13/43 (30%), [place]: 1/60 (2%) 10-22-19 Unable to assess    Time  8    Period  Weeks    Status  On-going    Target Date  12/17/19      PT LONG TERM GOAL #3   Title  Patient will describe no pain after sitting or laying down for>1 hr to demonstrate improved functional ability.    Baseline  Pain  increased with prolonged sitting/ laying to 4/10; 10-22-19 subjectively patient has described increased abiliity to participate in functional mobility with pain decreasing.    Time  8    Period  Weeks    Status  On-going    Target Date  12/17/19            Plan - 11/17/19 1650    Clinical Impression Statement  Pt. Responded well to all interventions today,  demonstrating improved R hip extension in prone, resolution of coccyx pain, improved R hamstrings pain, as well as understanding and correct performance of all education and exercises provided today. They will continue to benefit from skilled physical therapy to work toward remaining goals and maximize function as well as decrease likelihood of symptom increase or recurrence.    Personal Factors and Comorbidities  Comorbidity 1    Comorbidities  leg-length discrepancy    Examination-Activity Limitations  Other   exercise   Stability/Clinical Decision Making  Evolving/Moderate complexity    Rehab Potential  Good    PT Frequency  Biweekly    PT Duration  8 weeks    PT Treatment/Interventions  ADLs/Self Care Home Management;Moist Heat;Gait training;Therapeutic exercise;Therapeutic activities;Functional mobility training;Neuromuscular re-education;Patient/family education;Manual techniques;Passive range of motion;Dry needling;Taping;Spinal Manipulations;Joint Manipulations    PT Next Visit Plan  *watch pt do a deadlift* reassess L1 mobility and pelvic alignment assess/address mobility/decrease spasms in R shoulder; look at sacral mobility and relationship to pain    PT Home Exercise Plan  snow angel, thoracic extensions, side-stretch, side-plank, bow-and-arrow Bilat, horizontal ABD of shoulders with band, tall kneeling to half kneel; intrinsic foot musculature relaxation; half kneel thoracic rotation and hip flexor stretch; (re-adding internal PFM self release); added in external PFM self release; hamstrings belt stretch across midline     Consulted and Agree with Plan of Care  Patient       Patient will benefit from skilled therapeutic intervention in order to improve the following deficits and impairments:  Increased muscle spasms, Pain, Postural dysfunction  Visit Diagnosis: Other muscle spasm  Abnormal posture  Sacrococcygeal disorders, not elsewhere classified  Myalgia of pelvic floor     Problem List Patient Active Problem List   Diagnosis Date Noted  . Low serum cortisol level (HCC) 09/09/2017  . Serum estradiol <50 pg/ml 09/09/2017   Courtney Fenlon, SPT  Cleophus MoltKeeli T Gailes 11/18/2019, 10:31 AM  [facility] [address] [place], [place], [address] Phone: [phone]   Fax:  [phone]  Name: Jessica Finley MRN: [id number] Date of Birth: 12/23/1996

## 2019-11-17 NOTE — Patient Instructions (Addendum)
Towel roll with for hamstring stretch  Bring ankle across the chest- till you feel a stretch in hamstring.  Hold on R for 10 belly breaths, perform on L if it feels like a stretch       Self External Trigger Point Relief    1) Wash your hands and prop yourself up in a way where you can easily reach the vagina. You may wish to have a small hand-held mirror near by.  2) Use the 2 middle fingers to put gentle pressure on the three external pelvic floor muscles and hold pressure and take deep breaths as you allow the tension to release and discomfort to dissipate   3) Repeat the process for any trigger points you find spending between 3-10 minutes on this every 1-2 days until you do not find any more trigger points or you are told otherwise by your therapist.

## 2019-12-01 ENCOUNTER — Ambulatory Visit: Payer: 59 | Attending: Obstetrics & Gynecology

## 2019-12-01 ENCOUNTER — Other Ambulatory Visit: Payer: Self-pay

## 2019-12-01 DIAGNOSIS — R293 Abnormal posture: Secondary | ICD-10-CM | POA: Insufficient documentation

## 2019-12-01 DIAGNOSIS — M533 Sacrococcygeal disorders, not elsewhere classified: Secondary | ICD-10-CM | POA: Diagnosis not present

## 2019-12-01 DIAGNOSIS — M7918 Myalgia, other site: Secondary | ICD-10-CM | POA: Diagnosis not present

## 2019-12-01 DIAGNOSIS — M62838 Other muscle spasm: Secondary | ICD-10-CM | POA: Diagnosis not present

## 2019-12-01 NOTE — Therapy (Signed)
[place] MAIN [facility] SERVICES [address] [place], [place], [address] Phone: [phone]   Fax:  [phone]  Physical Therapy Treatment The patient has been informed of current processes in place at Outpatient Rehab to protect patients from Covid-19 exposure including social distancing, schedule modifications, and new cleaning procedures. After discussing their particular risk with a therapist based on the patient's personal risk factors, the patient has decided to proceed with in-person therapy.   Patient Details  Name: Jessica Finley MRN: [id number] Date of Birth: Mar 06, 1996 Referring Provider (PT): Marolyn Hammock   Encounter Date: 12/01/2019  PT End of Session - 12/03/19 0800    Visit Number  12    Number of Visits  14    Date for PT Re-Evaluation  10/26/19    Authorization - Visit Number  7    Authorization - Number of Visits  8    PT Start Time  1430    PT Stop Time  9485    PT Time Calculation (min)  60 min    Activity Tolerance  Patient tolerated treatment well;No increased pain    Behavior During Therapy  WFL for tasks assessed/performed       Past Medical History:  Diagnosis Date  . Recurrent tonsillitis 12/2017    Past Surgical History:  Procedure Laterality Date  . TONSILLECTOMY N/A 01/27/2018   Procedure: TONSILLECTOMY;  Surgeon: Izora Gala, MD;  Location: [facility];  Service: ENT;  Laterality: N/A;  . WISDOM TOOTH EXTRACTION      There were no vitals filed for this visit.     Pelvic Floor Physical Therapy Treatment Note  SCREENING  Changes in medications, allergies, or medical history?: no    SUBJECTIVE  Patient reports: Had pain in the L glute/HS on the 21st and then has had L hip pain every day since.  Precautions:  L L5 partial sacralization  Pain update:  Location of pain: L anterior hip; R hamstrings/groin pain  Current pain:  0/10  Max pain:  8/10 Least pain:   0/10 Nature of pain: ache - not pain  -- stayed 0/10 on NPS following session  Patient Goals: Not have hip pain   OBJECTIVE  Changes in: Observation/Posture: Alignment of pelvis neutral Pt. Is over-Externally-rotating hips with squat and dead-lift    Palpation: Decreased fascial mobility along left>R lateral thigh to decrease hip pain and improve body mechanics.  Strength: while MMT demonstrates 5/5 hip EXT B, Pt continues to have delayed recruitment of L glutes with squat and deadlift.   Range of Motion/Flexibilty:      INTERVENTIONS THIS SESSION: Manual: Performed skin-rolling, TP release and STM to L lateral thigh from hip to knee to decrease tension laterally and allow for improved balance of musculature around the hip and knee.  Therex:  Assessed and educated on mechanics of squats and dead-lifts as these seem to exacerbate Pt's symptoms. Gave moderate verbal, tactile, and visual cues to bring weight further back on heels and to keep hips neutral, with knees in line with the toes.    Total time: 60 min.                          PT Short Term Goals - 10/22/19 1525      PT SHORT TERM GOAL #1   Title  Patient will demonstrate improved balance of musculature surrounding the pelvis to facilitate decreased pain.    Baseline  Spasms surrounding the pelvis. 10-22 Decreased TTP located surrounding pelvis (pectineus and QL still present)    Time  4    Period  Weeks    Status  Achieved    Target Date  08/12/19      PT SHORT TERM GOAL #2   Title  Patient will demonstrate HEP x1 in the clinic to demonstrate understanding and proper form to allow for further improvement.    Baseline  Pt. lacks understanding of what therapeutic exercises will help reduce her pain.    Time  4    Period  Weeks    Status  Achieved    Target Date  08/12/19      PT SHORT TERM GOAL #3   Title  Patient will report a reduction in pain to no greater than 2/10 over the prior  week to demonstrate symptom improvement.    Baseline  Max of 4/10; 10-22 max of 4/10 in L hip - decreased frequency    Time  4    Period  Weeks    Status  On-going    Target Date  11/19/19        PT Long Term Goals - 10/22/19 1527      PT LONG TERM GOAL #1   Title  Pt. will demonstrate AROM through R shoulder that is comprable to L shoulder to demonstrate improved balance and decreased restriction of fascial obility that is impacting L hip.    Baseline  ~ 10 degrees less motion in all planes with TTP surrounding the scapula and anterior chest; 10-22 Subjective increase in scapular mobility following thoracolumbar mobility and rhomboid stability training.    Time  8    Period  Weeks    Status  On-going    Target Date  12/17/19      PT LONG TERM GOAL #2   Title  Patient will score less than or equal to 10% on the Female NIH-CPSI  and 0% on the [facility] to demonstrate a reduction in pain, urinary symptoms, and an improved quality of life.    Baseline  13/43 (30%), PDI: 1/60 (2%) 10-22-19 Unable to assess    Time  8    Period  Weeks    Status  On-going    Target Date  12/17/19      PT LONG TERM GOAL #3   Title  Patient will describe no pain after sitting or laying down for>1 hr to demonstrate improved functional ability.    Baseline  Pain increased with prolonged sitting/ laying to 4/10; 10-22-19 subjectively patient has described increased abiliity to participate in functional mobility with pain decreasing.    Time  8    Period  Weeks    Status  On-going    Target Date  12/17/19            Plan - 12/03/19 0800    Clinical Impression Statement  Pt. Responded well to all interventions today, demonstrating improved fascial mobility and body mechanics with squat and dead-lift weaning from moderate cueing to IND during the session.They will continue to benefit from skilled physical therapy to work toward remaining goals and maximize function as well as decrease likelihood of symptom  increase or recurrence.     Personal Factors and Comorbidities  Comorbidity 1    Comorbidities  leg-length discrepancy    Examination-Activity Limitations  Other   exercise   Stability/Clinical Decision Making  Evolving/Moderate complexity    Rehab Potential  Good  PT Frequency  Biweekly    PT Duration  8 weeks    PT Treatment/Interventions  ADLs/Self Care Home Management;Moist Heat;Gait training;Therapeutic exercise;Therapeutic activities;Functional mobility training;Neuromuscular re-education;Patient/family education;Manual techniques;Passive range of motion;Dry needling;Taping;Spinal Manipulations;Joint Manipulations    PT Next Visit Plan  reassess L1 mobility and pelvic alignment assess/address mobility/decrease spasms in R shoulder; look at sacral mobility and relationship to pain    PT Home Exercise Plan  snow angel, thoracic extensions, side-stretch, side-plank, bow-and-arrow Bilat, horizontal ABD of shoulders with band, tall kneeling to half kneel; intrinsic foot musculature relaxation; half kneel thoracic rotation and hip flexor stretch; (re-adding internal PFM self release); added in external PFM self release; hamstrings belt stretch across midline    Consulted and Agree with Plan of Care  Patient       Patient will benefit from skilled therapeutic intervention in order to improve the following deficits and impairments:  Increased muscle spasms, Pain, Postural dysfunction  Visit Diagnosis: Other muscle spasm  Abnormal posture  Sacrococcygeal disorders, not elsewhere classified  Myalgia of pelvic floor     Problem List Patient Active Problem List   Diagnosis Date Noted  . Low serum cortisol level (HCC) 09/09/2017  . Serum estradiol <50 pg/ml 09/09/2017   Cleophus Molt DPT, ATC Cleophus Molt 12/03/2019, 10:15 AM  [place] [facility] MAIN [facility] SERVICES [address] [place], [place], [address] Phone: [phone]   Fax:   [phone]  Name: Davelyn Gwinn MRN: [id number] Date of Birth: 1996-06-22

## 2019-12-01 NOTE — Patient Instructions (Signed)
  Keep your trunk as one unit and let it hinge forward from the hips as you push your bottom back and bend your knees at the same rate that you bend your hips. Keep your weight back toward your heels but do not actually lift the toes off the ground. Exhale starting just before and all the way through standing to help engage the glutes and lower tummy muscles.  KEEP YOUR KNEES BEHIND YOUR ANKLES, REACH BACK FOR A BENCH! Let your upper body "bow" forward more than you think is normal.

## 2019-12-15 ENCOUNTER — Ambulatory Visit: Payer: 59

## 2019-12-28 ENCOUNTER — Other Ambulatory Visit: Payer: Self-pay

## 2019-12-28 ENCOUNTER — Ambulatory Visit: Payer: 59

## 2019-12-28 DIAGNOSIS — M533 Sacrococcygeal disorders, not elsewhere classified: Secondary | ICD-10-CM

## 2019-12-28 DIAGNOSIS — R293 Abnormal posture: Secondary | ICD-10-CM | POA: Diagnosis not present

## 2019-12-28 DIAGNOSIS — M7918 Myalgia, other site: Secondary | ICD-10-CM | POA: Diagnosis not present

## 2019-12-28 DIAGNOSIS — M62838 Other muscle spasm: Secondary | ICD-10-CM

## 2019-12-28 NOTE — Therapy (Signed)
[place] [facility] [address] [place], [place], [address] Phone: [phone]   Fax:  [phone]  Physical Therapy Progress Note   Dates of reporting period  10/26/19   to   12/28/2019   The patient has been informed of current processes in place at Outpatient Rehab to protect patients from Covid-19 exposure including social distancing, schedule modifications, and new cleaning procedures. After discussing their particular risk with a therapist based on the patient's personal risk factors, the patient has decided to proceed with in-person therapy.   Patient Details  Name: Jessica RussellSara Finley MRN: [id number] Date of Birth: 12/30/1996 No data recorded  Encounter Date: 12/28/2019  PT End of Session - 12/30/19 0840    Visit Number  13    Number of Visits  14    Date for PT Re-Evaluation  10/26/19    Authorization - Visit Number  8    Authorization - Number of Visits  8    PT Start Time  1230    PT Stop Time  1330    PT Time Calculation (min)  60 min    Activity Tolerance  Patient tolerated treatment well;No increased pain    Behavior During Therapy  WFL for tasks assessed/performed       Past Medical History:  Diagnosis Date  . Recurrent tonsillitis 12/2017    Past Surgical History:  Procedure Laterality Date  . TONSILLECTOMY N/A 01/27/2018   Procedure: TONSILLECTOMY;  Surgeon: Serena Colonelosen, Jefry, MD;  Location: [facility];  Service: ENT;  Laterality: N/A;  . WISDOM TOOTH EXTRACTION      There were no vitals filed for this visit.  Pelvic Floor Physical Therapy Progress Note  SCREENING  Changes in medications, allergies, or medical history?: no    SUBJECTIVE  Patient reports: Sometimes having L hip pain first thing in the morning which eases off by ~ 30 min.  Some glute/hamstring pain. Has been trying to engage core more even with upper body exercise but it still seems to be bothering her HS. Has been aware of  how much she has been compensating and has been able to correct it but has to use less weight.   Precautions:  L L5 partial sacralization  Pain update:  Location of pain: L anterior hip; R hamstrings/groin pain  Current pain:  4/10  Max pain:  6/10 Least pain:  0/10 Nature of pain: ache    **Pain in R HS stopped following spinal mobility/spasm reduction, before TP release distally. All pain relieved at end of session. It appears that spine is root cause of pain at this time.  Patient Goals: Not have hip pain   OBJECTIVE  Changes in: Observation/Posture: Slight L rib-shift. Slight L thoracolumbar scoliosis  Palpation: TTP to L iliacus/Psoas, R semimembranosus, and multifidi and paraspinals near T-L junction at apex of scoliotic curve.   Strength: 5/5 strength but inconsistent timing between lumbar multifidus, Glute, and HS.  Range of Motion/Flexibilty:  Decreased mobility at T-L junction    INTERVENTIONS THIS SESSION: Manual: Performed TP release to multifidi and paraspinals surrounding T-L junction and grade 3-4 PA and L to R mobs to restore mobility and decrease spasms for decreased pressure on nerve roots and decreased pain and spasm distally.  Therex:  Given rib-shift correction with L shoulder on wall to improve spinal mobility and QL length to decrease pressure on nerve roots leading to hip-flexors and HS.    Total time: 60 min.  PT Short Term Goals - 12/30/19 6301      PT SHORT TERM GOAL #1   Title  Patient will demonstrate improved balance of musculature surrounding the pelvis to facilitate decreased pain.    Baseline  Spasms surrounding the pelvis. 10-22 Decreased TTP located surrounding pelvis (pectineus and QL still present)    Time  4    Period  Weeks    Status  Achieved    Target Date  08/12/19      PT SHORT TERM GOAL #2   Title  Patient will demonstrate HEP x1 in the clinic to demonstrate  understanding and proper form to allow for further improvement.    Baseline  Pt. lacks understanding of what therapeutic exercises will help reduce her pain.    Time  4    Period  Weeks    Status  Achieved    Target Date  08/12/19      PT SHORT TERM GOAL #3   Title  Patient will report a reduction in pain to no greater than 2/10 over the prior week to demonstrate symptom improvement.    Baseline  Max of 4/10; 10-22 max of 4/10 in L hip - decreased frequency, As of 12/28: Pt. often having a week without pain before it returns and only pain with certain activities such as prolonged sitting.    Time  12    Period  Weeks    Status  On-going    Target Date  03/23/20        PT Long Term Goals - 12/30/19 0838      PT LONG TERM GOAL #1   Title  Pt. will demonstrate AROM through R shoulder that is comprable to L shoulder to demonstrate improved balance and decreased restriction of fascial obility that is impacting L hip.    Baseline  ~ 10 degrees less motion in all planes with TTP surrounding the scapula and anterior chest; 10-22 Subjective increase in scapular mobility following thoracolumbar mobility and rhomboid stability training.    Time  8    Period  Weeks    Status  Achieved    Target Date  12/17/19      PT LONG TERM GOAL #2   Title  Patient will score less than or equal to 10% on the Female NIH-CPSI  and 0% on the [facility] to demonstrate a reduction in pain, urinary symptoms, and an improved quality of life.    Baseline  13/43 (30%), PDI: 1/60 (2%) 10-22-19 Unable to assess    Time  8    Period  Weeks    Status  On-going    Target Date  03/23/20      PT LONG TERM GOAL #3   Title  Patient will describe no pain after sitting or laying down for>1 hr to demonstrate improved functional ability.    Baseline  Pain increased with prolonged sitting/ laying to 4/10; 10-22-19 subjectively patient has described increased abiliity to participate in functional mobility with pain decreasing.     Time  8    Period  Weeks    Status  On-going    Target Date  03/23/20            Plan - 12/30/19 0841    Clinical Impression Statement  Pt. continues to demonstrate improvement in balance of musculature, awareness of body mechanics and habits, and demonstrates decreased frequency of pain. As she is a very active individual, it has been tough to completely prevent return  of spasms because Pt. demonstrates bhabits with weightlifting activities that predispose her to perpetuating muscular imbalance. The patient is now aware of the habits she needs to change but it takes time to change and in the mean time she will continue to need assistance with decreasing spasms and targeted strengthening to decrease scoliotic curve and off-load nerve roots to allow for long-term relief of pain and spasm. She will benefit from contined therapy on an every-other-week basis to continue to work toward her goals and prevent return of pain.    Personal Factors and Comorbidities  Comorbidity 1    Comorbidities  leg-length discrepancy    Examination-Activity Limitations  Other   exercise   Stability/Clinical Decision Making  Evolving/Moderate complexity    Clinical Decision Making  Moderate    Rehab Potential  Good    PT Frequency  Biweekly    PT Duration  12 weeks    PT Treatment/Interventions  ADLs/Self Care Home Management;Moist Heat;Gait training;Therapeutic exercise;Therapeutic activities;Functional mobility training;Neuromuscular re-education;Patient/family education;Manual techniques;Passive range of motion;Dry needling;Taping;Spinal Manipulations;Joint Manipulations    PT Next Visit Plan  Targeted mobility/strength and control for T-L junction, re-assess R shoulder breifly    PT Home Exercise Plan  snow angel, thoracic extensions, side-stretch, side-plank, bow-and-arrow Bilat, horizontal ABD of shoulders with band, tall kneeling to half kneel; intrinsic foot musculature relaxation; half kneel thoracic  rotation and hip flexor stretch; (re-adding internal PFM self release); added in external PFM self release; hamstrings belt stretch across midline    Consulted and Agree with Plan of Care  Patient       Patient will benefit from skilled therapeutic intervention in order to improve the following deficits and impairments:  Increased muscle spasms, Pain, Postural dysfunction  Visit Diagnosis: Other muscle spasm - Plan: PT plan of care cert/re-cert  Abnormal posture - Plan: PT plan of care cert/re-cert  Sacrococcygeal disorders, not elsewhere classified - Plan: PT plan of care cert/re-cert     Problem List Patient Active Problem List   Diagnosis Date Noted  . Low serum cortisol level ([place]) 09/09/2017  . Serum estradiol <50 pg/ml 09/09/2017   Willa Rough DPT, ATC Willa Rough 12/30/2019, 9:04 AM  [place] MAIN [facility] SERVICES [address] [place], [place], [address] Phone: [phone]   Fax:  [phone]  Name: Laaibah Wartman MRN: [id number] Date of Birth: October 16, 1996

## 2020-01-14 ENCOUNTER — Other Ambulatory Visit: Payer: Self-pay

## 2020-01-14 ENCOUNTER — Ambulatory Visit: Payer: No Typology Code available for payment source | Attending: Obstetrics & Gynecology

## 2020-01-14 DIAGNOSIS — M533 Sacrococcygeal disorders, not elsewhere classified: Secondary | ICD-10-CM | POA: Diagnosis present

## 2020-01-14 DIAGNOSIS — R293 Abnormal posture: Secondary | ICD-10-CM | POA: Insufficient documentation

## 2020-01-14 DIAGNOSIS — M62838 Other muscle spasm: Secondary | ICD-10-CM | POA: Diagnosis present

## 2020-01-14 NOTE — Therapy (Signed)
[facility] MAIN [facility] SERVICES [address] [place], [place], [address] Phone: [phone]   Fax:  [phone]  Physical Therapy Treatment  The patient has been informed of current processes in place at Outpatient Rehab to protect patients from Covid-19 exposure including social distancing, schedule modifications, and new cleaning procedures. After discussing their particular risk with a therapist based on the patient's personal risk factors, the patient has decided to proceed with in-person therapy.   Patient Details  Name: Jessica Finley MRN: [id number] Date of Birth: [birth year] No data recorded  Encounter Date: 01/14/2020  PT End of Session - 01/14/20 1547    Visit Number  14    Number of Visits  19    Date for PT Re-Evaluation  10/26/19    Authorization Type  [employer]    Authorization Time Period  12-28-19 through 03/23/20    Authorization - Visit Number  1    Authorization - Number of Visits  6    PT Start Time  1430    PT Stop Time  1535    PT Time Calculation (min)  65 min    Activity Tolerance  Patient tolerated treatment well;No increased pain    Behavior During Therapy  WFL for tasks assessed/performed       Past Medical History:  Diagnosis Date  . Recurrent tonsillitis 12/2017    Past Surgical History:  Procedure Laterality Date  . TONSILLECTOMY N/A 01/27/2018   Procedure: TONSILLECTOMY;  Surgeon: Serena Colonel, MD;  Location: [facility];  Service: ENT;  Laterality: N/A;  . WISDOM TOOTH EXTRACTION      There were no vitals filed for this visit.   Pelvic Floor Physical Therapy Progress Note  SCREENING  Changes in medications, allergies, or medical history?: no    SUBJECTIVE  Patient reports: The last couple of times she worked out, only her R glutes are getting sore. She is noticing a pattern that the day after she does a lower body workout is when she has increased glute/ham pain. Jessica Finley has increased L  hip pain with sitting in the car and at work. Thinks she may be having less frequency of pain but is unsure.  Precautions:  L L5 partial sacralization  Pain update:  Location of pain: L anterior hip; R hamstrings/groin pain  Current pain:  0/10  Max pain:  4/10 Least pain:  0/10 Nature of pain: ache    **Pain in R HS stopped following spinal mobility/spasm reduction, before TP release distally. All pain relieved at end of session. It appears that spine is root cause of pain at this time.  Patient Goals: Not have hip pain   OBJECTIVE  Changes in: Observation/Posture: Pelvis 95% improved, mild L in-flare.  Scapular dysrhythmia on L>R with audible pop   Palpation: TTP to Infraspinatus, Subscapularis, Teres major and minor on L, decreased fascial mobility  Strength: 5/5 strength but inconsistent timing between lumbar multifidus, Glute, and HS.  Range of Motion/Flexibilty:  Decreased mobility at T-L junction    INTERVENTIONS THIS SESSION: Manual: Performed TP release to Infraspinatus, Subscapularis, Teres major and minor on L to decrease spasm and pain and allow for improved balance of musculature for improved function and decreased symptoms.  Therex:  Given shoulder IR,ER, and scaption shoulder ABD to allow for improved body mechanics with exercise and prevent return of spasm and pain.     Total time: 60 min.  PT Short Term Goals - 12/30/19 7564      PT SHORT TERM GOAL #1   Title  Patient will demonstrate improved balance of musculature surrounding the pelvis to facilitate decreased pain.    Baseline  Spasms surrounding the pelvis. 10-22 Decreased TTP located surrounding pelvis (pectineus and QL still present)    Time  4    Period  Weeks    Status  Achieved    Target Date  08/12/19      PT SHORT TERM GOAL #2   Title  Patient will demonstrate HEP x1 in the clinic to demonstrate understanding and proper form to  allow for further improvement.    Baseline  Pt. lacks understanding of what therapeutic exercises will help reduce her pain.    Time  4    Period  Weeks    Status  Achieved    Target Date  08/12/19      PT SHORT TERM GOAL #3   Title  Patient will report a reduction in pain to no greater than 2/10 over the prior week to demonstrate symptom improvement.    Baseline  Max of 4/10; 10-22 max of 4/10 in L hip - decreased frequency, As of 12/28: Pt. often having a week without pain before it returns and only pain with certain activities such as prolonged sitting.    Time  12    Period  Weeks    Status  On-going    Target Date  03/23/20        PT Long Term Goals - 12/30/19 0838      PT LONG TERM GOAL #1   Title  Pt. will demonstrate AROM through R shoulder that is comprable to L shoulder to demonstrate improved balance and decreased restriction of fascial obility that is impacting L hip.    Baseline  ~ 10 degrees less motion in all planes with TTP surrounding the scapula and anterior chest; 10-22 Subjective increase in scapular mobility following thoracolumbar mobility and rhomboid stability training.    Time  8    Period  Weeks    Status  Achieved    Target Date  12/17/19      PT LONG TERM GOAL #2   Title  Patient will score less than or equal to 10% on the Female NIH-CPSI  and 0% on the [facility] to demonstrate a reduction in pain, urinary symptoms, and an improved quality of life.    Baseline  13/43 (30%), PDI: 1/60 (2%) 10-22-19 Unable to assess    Time  8    Period  Weeks    Status  On-going    Target Date  03/23/20      PT LONG TERM GOAL #3   Title  Patient will describe no pain after sitting or laying down for>1 hr to demonstrate improved functional ability.    Baseline  Pain increased with prolonged sitting/ laying to 4/10; 10-22-19 subjectively patient has described increased abiliity to participate in functional mobility with pain decreasing.    Time  8    Period  Weeks    Status   On-going    Target Date  03/23/20            Plan - 01/14/20 1549    Clinical Impression Statement  Pt. Responded well to all interventions today, demonstrating decreased spasm, increased myofascial mobility, and improved scapulohumeral rhythm and upward rotation ROM on the L as well as understanding and correct performance of all education and exercises  provided today. They will continue to benefit from skilled physical therapy to work toward remaining goals and maximize function as well as decrease likelihood of symptom increase or recurrence.     PT Next Visit Plan  release R shoulder/scap and build on rotator cuff strengthening.Targeted mobility/strength and control for T-L junction,    PT Home Exercise Plan  snow angel, thoracic extensions, side-stretch, side-plank, bow-and-arrow Bilat, horizontal ABD of shoulders with band, tall kneeling to half kneel; intrinsic foot musculature relaxation; half kneel thoracic rotation and hip flexor stretch; (re-adding internal PFM self release); added in external PFM self release; hamstrings belt stretch across midline, shoulder IR, ER, scaption abduction with red band.       Patient will benefit from skilled therapeutic intervention in order to improve the following deficits and impairments:     Visit Diagnosis: Other muscle spasm  Abnormal posture  Sacrococcygeal disorders, not elsewhere classified     Problem List Patient Active Problem List   Diagnosis Date Noted  . Low serum cortisol level ([facility]) 09/09/2017  . Serum estradiol <50 pg/ml 09/09/2017   Cleophus Molt DPT, ATC Cleophus Molt 01/15/2020, 12:12 PM  [facility] MAIN [facility] SERVICES [address] [place], [place], [address] Phone: [phone]   Fax:  [phone]  Name: Jessica Finley MRN: [id number] Date of Birth: September 20, 1996

## 2020-01-14 NOTE — Patient Instructions (Signed)
       Do 2-3 sets of 10, working your way up in reps before increasing band strength.

## 2020-01-28 ENCOUNTER — Other Ambulatory Visit: Payer: Self-pay

## 2020-01-28 ENCOUNTER — Ambulatory Visit: Payer: No Typology Code available for payment source

## 2020-01-28 DIAGNOSIS — M62838 Other muscle spasm: Secondary | ICD-10-CM | POA: Diagnosis not present

## 2020-01-28 DIAGNOSIS — R293 Abnormal posture: Secondary | ICD-10-CM

## 2020-01-28 DIAGNOSIS — M533 Sacrococcygeal disorders, not elsewhere classified: Secondary | ICD-10-CM

## 2020-01-28 NOTE — Therapy (Addendum)
[facility] MAIN [facility] SERVICES [address] [place], [place], [address] Phone: [phone]   Fax:  [phone]  Physical Therapy Treatment  The patient has been informed of current processes in place at [facility] to protect patients from Covid-19 exposure including social distancing, schedule modifications, and new cleaning procedures. After discussing their particular risk with a therapist based on the patient's personal risk factors, the patient has decided to proceed with in-person therapy.   Patient Details  Name: Jessica Finley MRN: [id number] Date of Birth: [birth year] No data recorded  Encounter Date: 01/28/2020  PT End of Session - 02/01/20 0829    Visit Number  15    Number of Visits  25    Date for PT Re-Evaluation  03/23/20    Authorization Type  [facility] [employer]    Authorization Time Period  12-28-19 through 03/23/20    Authorization - Visit Number  2    Authorization - Number of Visits  6    PT Start Time  4742    PT Stop Time  1535    PT Time Calculation (min)  60 min    Activity Tolerance  Patient tolerated treatment well;No increased pain    Behavior During Therapy  WFL for tasks assessed/performed       Past Medical History:  Diagnosis Date  . Recurrent tonsillitis 12/2017    Past Surgical History:  Procedure Laterality Date  . TONSILLECTOMY N/A 01/27/2018   Procedure: TONSILLECTOMY;  Surgeon: Izora Gala, MD;  Location: [facility];  Service: ENT;  Laterality: N/A;  . WISDOM TOOTH EXTRACTION      There were no vitals filed for this visit.   Pelvic Floor Physical Therapy Progress Note  SCREENING  Changes in medications, allergies, or medical history?: no    SUBJECTIVE  Patient reports: Has had less pain in the left hip. Lifted heavy and had more soreness in L glute/ham than the R. Has had a little R glute/ham pain 1-2 times now but not as much as it was.  Precautions:  L L5 partial  sacralization  Pain update:  Location of pain: L anterior hip; R hamstrings/groin pain  Current pain:  0/10  Max pain:  4/10 Least pain:  0/10 Nature of pain: ache    Patient Goals: Not have hip pain   OBJECTIVE  Changes in: Observation/Posture:  Scapular dysrhythmia on R>L   Palpation: TTP to Infraspinatus, Subscapularis, Teres major and minor on R, decreased fascial mobility  Strength: 5/5 strength but inconsistent timing between lumbar multifidus, Glute, and HS. (from previous session)  Range of Motion/Flexibilty:  Decreased upward rotation of R>L scapula with shoulder abduction.    INTERVENTIONS THIS SESSION: Manual: Performed TP release to Infraspinatus, Subscapularis, Teres major and minor on R to decrease spasm and pain and allow for improved balance of musculature for improved function and decreased symptoms.  Therex: re-assessed squat and dead-lift mechanics and made adjustments to form to encourage appropriate glute recruitment without over-emphasizing it to improve balance between HS and glutes to prevent pain and spasm. Educated on and practiced TKE leg-lifts with slight hip ER to bias VMO and improve balance of musculature around the R knee and improve tracking of the patella to prevent/ decrease pain and spasm.     Total time: 60 min.                             PT Short  Term Goals - 12/30/19 0836      PT SHORT TERM GOAL #1   Title  Patient will demonstrate improved balance of musculature surrounding the pelvis to facilitate decreased pain.    Baseline  Spasms surrounding the pelvis. 10-22 Decreased TTP located surrounding pelvis (pectineus and QL still present)    Time  4    Period  Weeks    Status  Achieved    Target Date  08/12/19      PT SHORT TERM GOAL #2   Title  Patient will demonstrate HEP x1 in the clinic to demonstrate understanding and proper form to allow for further improvement.    Baseline  Pt. lacks  understanding of what therapeutic exercises will help reduce her pain.    Time  4    Period  Weeks    Status  Achieved    Target Date  08/12/19      PT SHORT TERM GOAL #3   Title  Patient will report a reduction in pain to no greater than 2/10 over the prior week to demonstrate symptom improvement.    Baseline  Max of 4/10; 10-22 max of 4/10 in L hip - decreased frequency, As of 12/28: Pt. often having a week without pain before it returns and only pain with certain activities such as prolonged sitting.    Time  12    Period  Weeks    Status  On-going    Target Date  03/23/20        PT Long Term Goals - 12/30/19 0838      PT LONG TERM GOAL #1   Title  Pt. will demonstrate AROM through R shoulder that is comprable to L shoulder to demonstrate improved balance and decreased restriction of fascial obility that is impacting L hip.    Baseline  ~ 10 degrees less motion in all planes with TTP surrounding the scapula and anterior chest; 10-22 Subjective increase in scapular mobility following thoracolumbar mobility and rhomboid stability training.    Time  8    Period  Weeks    Status  Achieved    Target Date  12/17/19      PT LONG TERM GOAL #2   Title  Patient will score less than or equal to 10% on the Female NIH-CPSI  and 0% on the [facility] to demonstrate a reduction in pain, urinary symptoms, and an improved quality of life.    Baseline  13/43 (30%), PDI: 1/60 (2%) 10-22-19 Unable to assess    Time  8    Period  Weeks    Status  On-going    Target Date  03/23/20      PT LONG TERM GOAL #3   Title  Patient will describe no pain after sitting or laying down for>1 hr to demonstrate improved functional ability.    Baseline  Pain increased with prolonged sitting/ laying to 4/10; 10-22-19 subjectively patient has described increased abiliity to participate in functional mobility with pain decreasing.    Time  8    Period  Weeks    Status  On-going    Target Date  03/23/20             Plan - 02/01/20 0829    Clinical Impression Statement  Pt. Responded well to all interventions today, demonstrating improved R scapular mobility and decreased spasm and myofascial restriction as well as understanding and correct performance of all education and exercises provided today. They will continue to benefit from  skilled physical therapy to work toward remaining goals and maximize function as well as decrease likelihood of symptom increase or recurrence.     PT Next Visit Plan  re-assess shoulder strength and diagonal hip/glute, build on rotator cuff strengthening.Targeted mobility/strength and control for T-L junction,    PT Home Exercise Plan  snow angel, thoracic extensions, side-stretch, side-plank, bow-and-arrow Bilat, horizontal ABD of shoulders with band, tall kneeling to half kneel; intrinsic foot musculature relaxation; half kneel thoracic rotation and hip flexor stretch; (re-adding internal PFM self release); added in external PFM self release; hamstrings belt stretch across midline, shoulder IR, ER, scaption abduction with red band.    Consulted and Agree with Plan of Care  Patient       Patient will benefit from skilled therapeutic intervention in order to improve the following deficits and impairments:     Visit Diagnosis: Other muscle spasm  Abnormal posture  Sacrococcygeal disorders, not elsewhere classified     Problem List Patient Active Problem List   Diagnosis Date Noted  . Low serum cortisol level (HCC) 09/09/2017  . Serum estradiol <50 pg/ml 09/09/2017   Cleophus Molt DPT, ATC Cleophus Molt 02/01/2020, 8:30 AM   [facility] MAIN [facility] SERVICES [address] [place], [place], [address] Phone: [phone]   Fax:  [phone]  Name: Shantoria Ellwood MRN: [id number] Date of Birth: September 16, 1996

## 2020-01-28 NOTE — Patient Instructions (Signed)
  3x15 with red band for 3 weeks

## 2020-02-18 ENCOUNTER — Ambulatory Visit: Payer: No Typology Code available for payment source

## 2020-03-03 ENCOUNTER — Ambulatory Visit: Payer: No Typology Code available for payment source

## 2020-03-17 ENCOUNTER — Ambulatory Visit: Payer: No Typology Code available for payment source

## 2020-03-31 ENCOUNTER — Ambulatory Visit: Payer: No Typology Code available for payment source

## 2020-04-21 ENCOUNTER — Ambulatory Visit: Payer: No Typology Code available for payment source
